# Patient Record
Sex: Female | Born: 1950 | Race: White | Hispanic: No | Marital: Married | Smoking: Never smoker
Health system: Southern US, Community
[De-identification: ages and names within clinical notes are randomized; demographics above are authoritative.]

## PROBLEM LIST (undated history)

## (undated) DIAGNOSIS — E559 Vitamin D deficiency, unspecified: Secondary | ICD-10-CM

## (undated) DIAGNOSIS — M1990 Unspecified osteoarthritis, unspecified site: Secondary | ICD-10-CM

## (undated) DIAGNOSIS — Z87442 Personal history of urinary calculi: Secondary | ICD-10-CM

## (undated) DIAGNOSIS — E119 Type 2 diabetes mellitus without complications: Secondary | ICD-10-CM

## (undated) DIAGNOSIS — F329 Major depressive disorder, single episode, unspecified: Secondary | ICD-10-CM

## (undated) DIAGNOSIS — G4730 Sleep apnea, unspecified: Secondary | ICD-10-CM

## (undated) DIAGNOSIS — E785 Hyperlipidemia, unspecified: Secondary | ICD-10-CM

## (undated) DIAGNOSIS — F32A Depression, unspecified: Secondary | ICD-10-CM

## (undated) DIAGNOSIS — E039 Hypothyroidism, unspecified: Secondary | ICD-10-CM

## (undated) DIAGNOSIS — M75100 Unspecified rotator cuff tear or rupture of unspecified shoulder, not specified as traumatic: Secondary | ICD-10-CM

## (undated) HISTORY — PX: ESOPHAGOGASTRODUODENOSCOPY: SHX1529

## (undated) HISTORY — PX: CHOLECYSTECTOMY: SHX55

## (undated) HISTORY — PX: CARPAL TUNNEL RELEASE: SHX101

## (undated) HISTORY — PX: ABDOMINAL HYSTERECTOMY: SHX81

## (undated) HISTORY — PX: COLONOSCOPY: SHX174

---

## 1994-04-18 HISTORY — PX: BREAST EXCISIONAL BIOPSY: SUR124

## 2004-02-19 ENCOUNTER — Ambulatory Visit: Payer: Self-pay

## 2004-03-15 ENCOUNTER — Ambulatory Visit: Payer: Self-pay | Admitting: Obstetrics and Gynecology

## 2004-07-06 ENCOUNTER — Ambulatory Visit: Payer: Self-pay | Admitting: General Practice

## 2005-04-28 ENCOUNTER — Ambulatory Visit: Payer: Self-pay

## 2005-05-11 ENCOUNTER — Ambulatory Visit: Payer: Self-pay | Admitting: Unknown Physician Specialty

## 2005-09-07 ENCOUNTER — Ambulatory Visit: Payer: Self-pay | Admitting: Unknown Physician Specialty

## 2006-01-13 ENCOUNTER — Ambulatory Visit: Payer: Self-pay | Admitting: Internal Medicine

## 2006-11-20 ENCOUNTER — Ambulatory Visit: Payer: Self-pay

## 2007-04-17 ENCOUNTER — Ambulatory Visit: Payer: Self-pay

## 2008-06-09 ENCOUNTER — Ambulatory Visit: Payer: Self-pay | Admitting: Internal Medicine

## 2008-06-24 ENCOUNTER — Ambulatory Visit: Payer: Self-pay | Admitting: Internal Medicine

## 2008-11-05 ENCOUNTER — Ambulatory Visit: Payer: Self-pay | Admitting: General Practice

## 2009-01-21 ENCOUNTER — Ambulatory Visit: Payer: Self-pay

## 2010-03-22 ENCOUNTER — Ambulatory Visit: Payer: Self-pay

## 2010-07-23 ENCOUNTER — Emergency Department: Payer: Self-pay | Admitting: Emergency Medicine

## 2011-05-11 ENCOUNTER — Ambulatory Visit: Payer: Self-pay

## 2011-09-19 ENCOUNTER — Ambulatory Visit: Payer: Self-pay | Admitting: Unknown Physician Specialty

## 2011-09-21 LAB — PATHOLOGY REPORT

## 2012-05-14 ENCOUNTER — Ambulatory Visit: Payer: Self-pay

## 2013-01-25 IMAGING — CT CT ABD-PELV W/O CM
1 of 2 series · 15 of 32 positions shown, 19 images · non-contrast
Comparison: none

REASON FOR EXAM: (1) LLQ pain hx of diverticulitis; (2) LLQ pain hx of
diverticulitis;    NOTE: N
COMMENTS:

PROCEDURE:     CT  - CT ABDOMEN AND PELVIS W[DATE]  [DATE]
RESULT:
TECHNIQUE: Helical noncontrast 5 mm sections were obtained from the lung
bases through the pubic symphysis.

[Series 2: abdomen · axial · 0.83mm/px · z∈[-868,-428]mm · 15 of 98 slices shown, 19 images]
[im 5/98  soft-tissue]
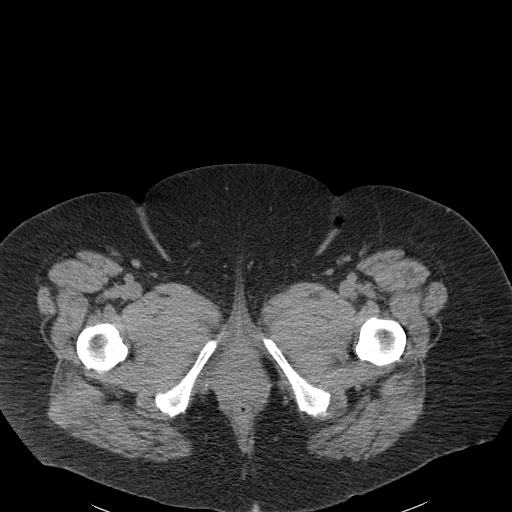
[im 5/98  bone]
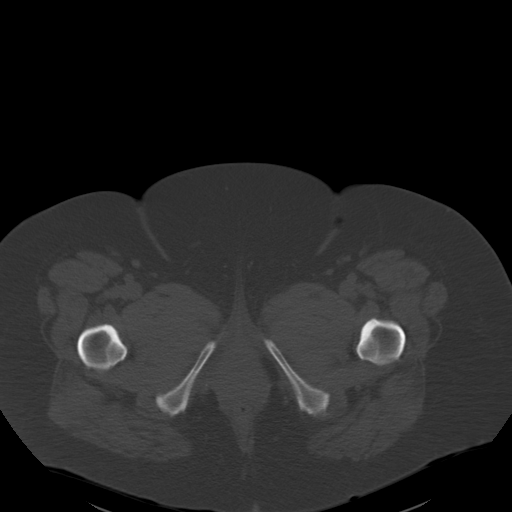
[im 13/98  soft-tissue]
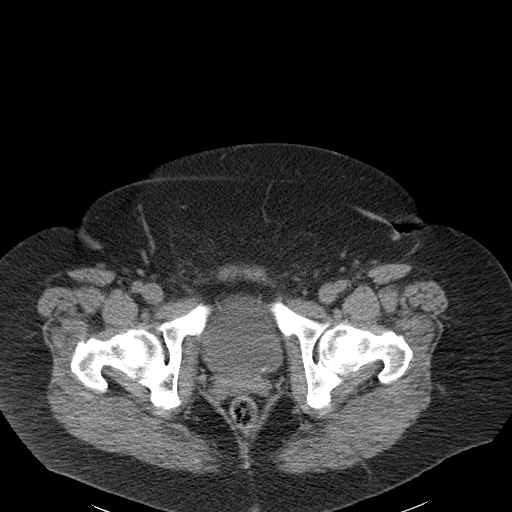
[im 21/98  soft-tissue]
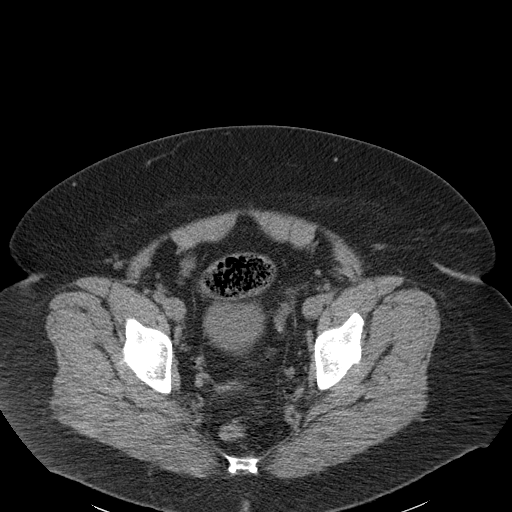
[im 29/98  soft-tissue]
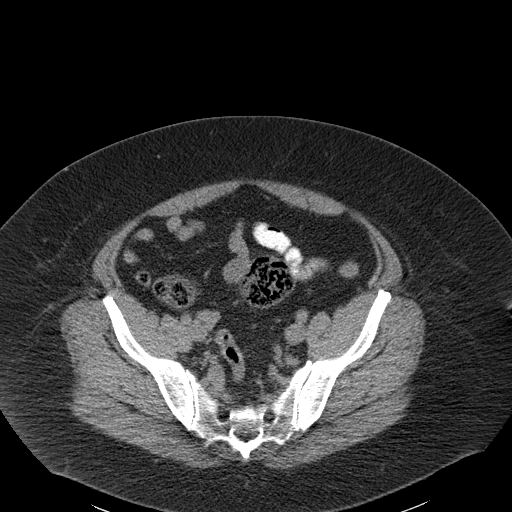
[im 33/98  soft-tissue]
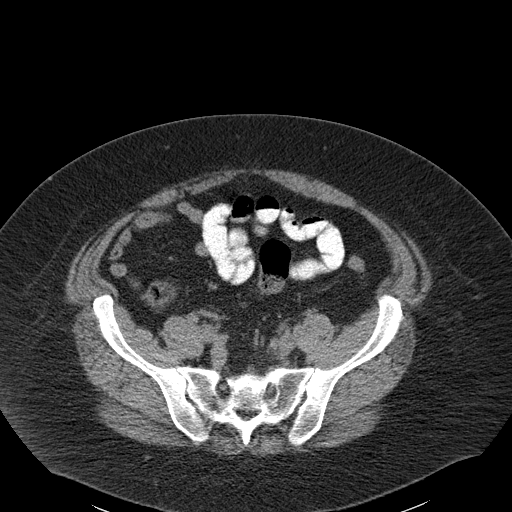
[im 41/98  soft-tissue]
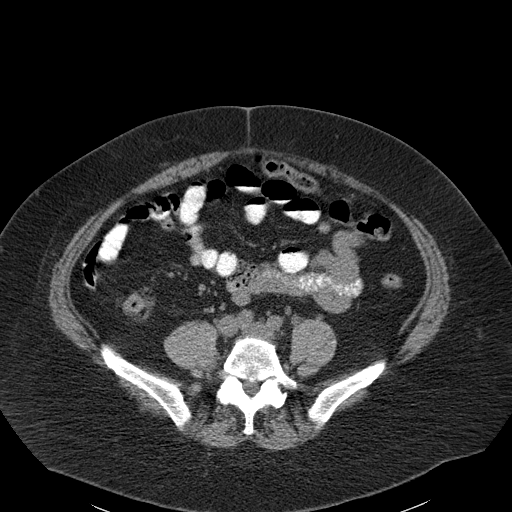
[im 49/98  soft-tissue]
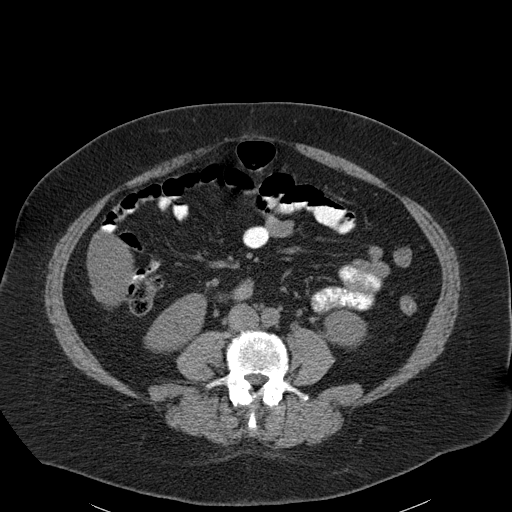
[im 57/98  soft-tissue]
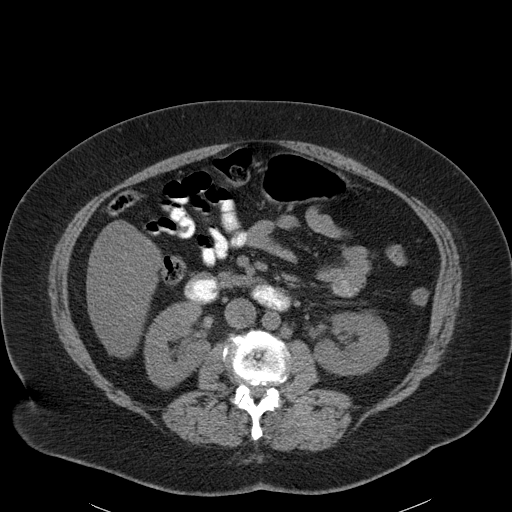
[im 65/98  soft-tissue]
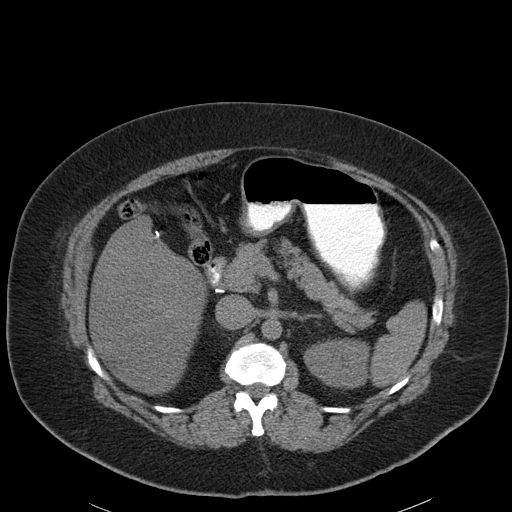
[im 65/98  bone]
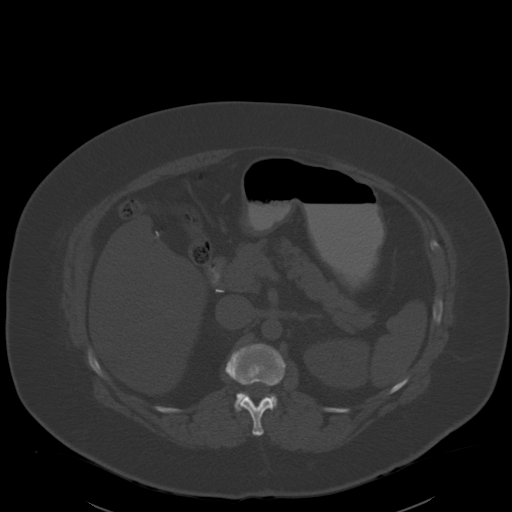
[im 69/98  soft-tissue]
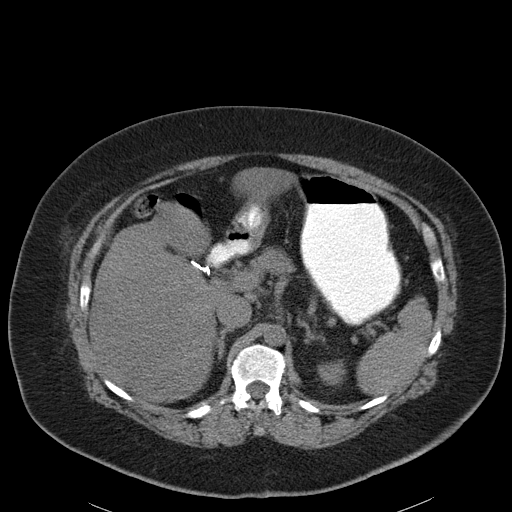
[im 77/98  soft-tissue]
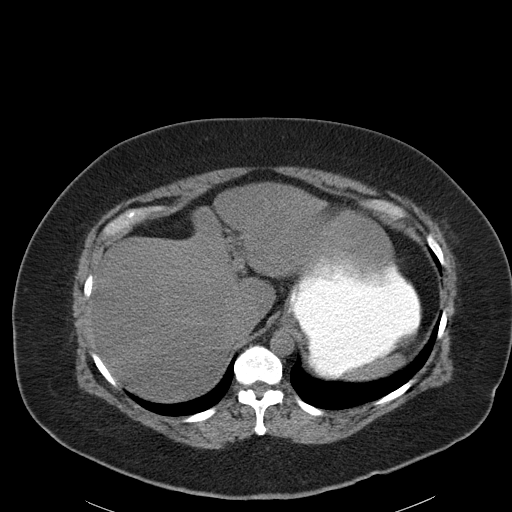
[im 81/98  lung]
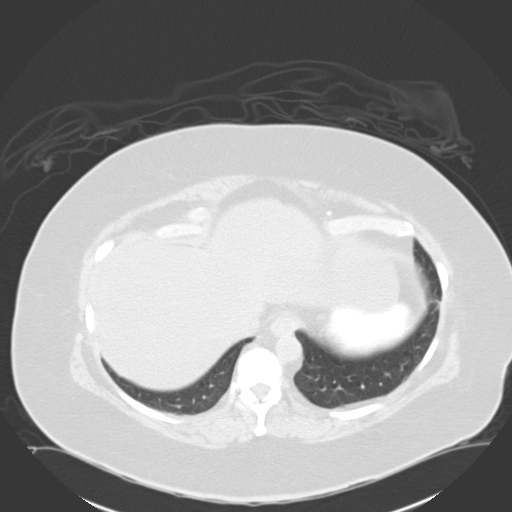
[im 85/98  soft-tissue]
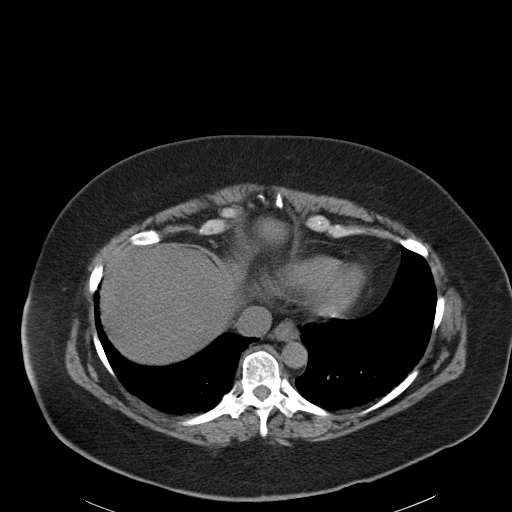
[im 85/98  lung]
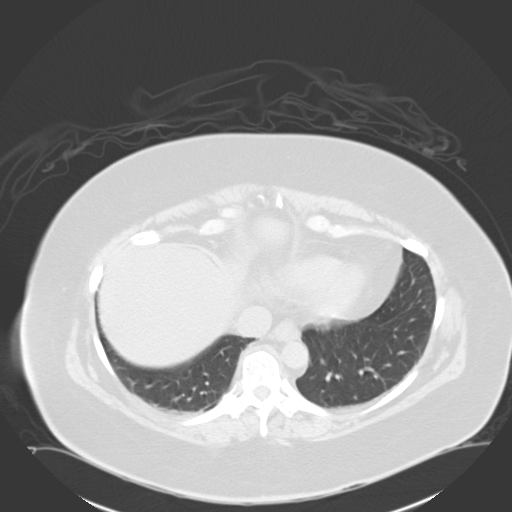
[im 89/98  lung]
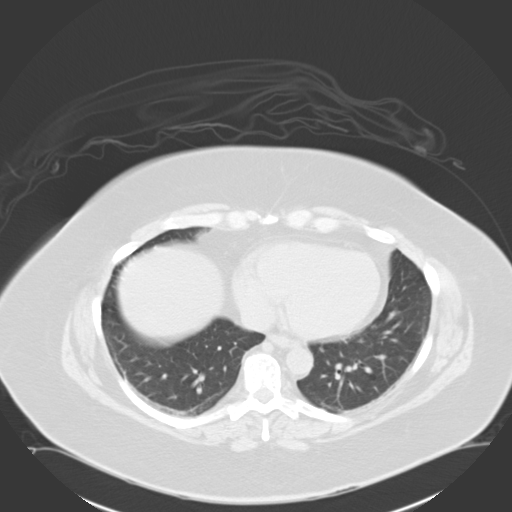
[im 93/98  soft-tissue]
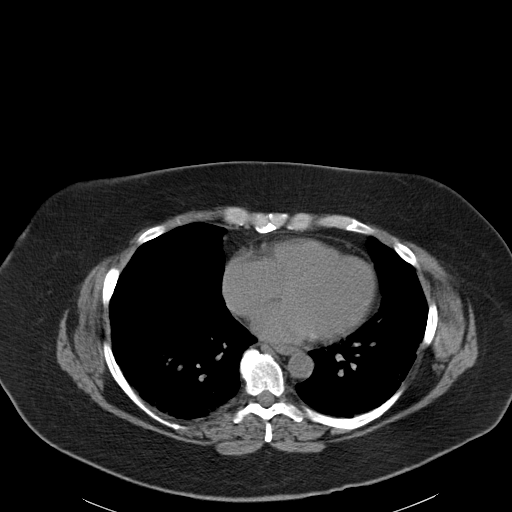
[im 93/98  lung]
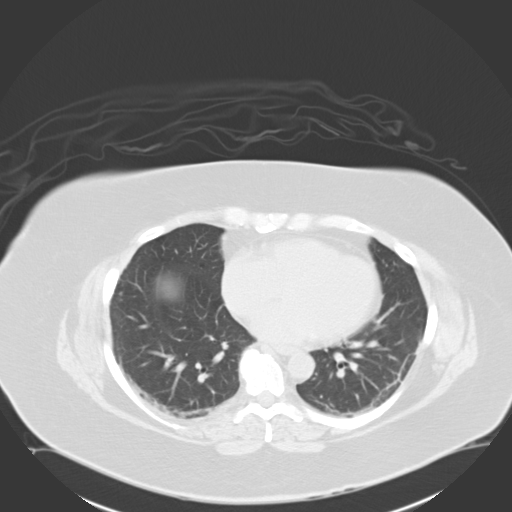

[15 of 32 positions shown; findings below may reference images not displayed]

FINDINGS: Evaluation of the left urinary collection system demonstrates a
very small calculus within the vesicoureteral junction measuring
approximately 1 mm. There is mild hydronephrosis on the left. There is no
further evidence of calculus disease within the left system.

There is no evidence of hydronephrosis nor calculi within the right system.

Noncontrast evaluation of the liver, spleen, adrenals, pancreas and kidneys
are unremarkable. There is no CT evidence of bowel obstruction. There is no
evidence of an abdominal aortic aneurysm.
IMPRESSION: 1. Very small left vesicoureteral junction calculus.
2. No further evidence of acute obstructive or inflammatory abnormalities.
3. There is mild hydronephrosis.

Cati Hg, Physician's Assistant of the Emergency Department was
informed of these findings via a preliminary faxed report.

## 2013-01-25 IMAGING — CR DG ABDOMEN 3V
1 series · 6 of 6 positions shown · non-contrast
Comparison: none

REASON FOR EXAM: abd pain left
COMMENTS:

PROCEDURE:     DXR - DXR ABDOMEN 3-WAY (INCL PA CXR)  - July 23, 2010  [DATE]
RESULT:

[Series 1: view not recorded · 0.17mm/px · 6 of 6 slices shown]
[im 1/6]
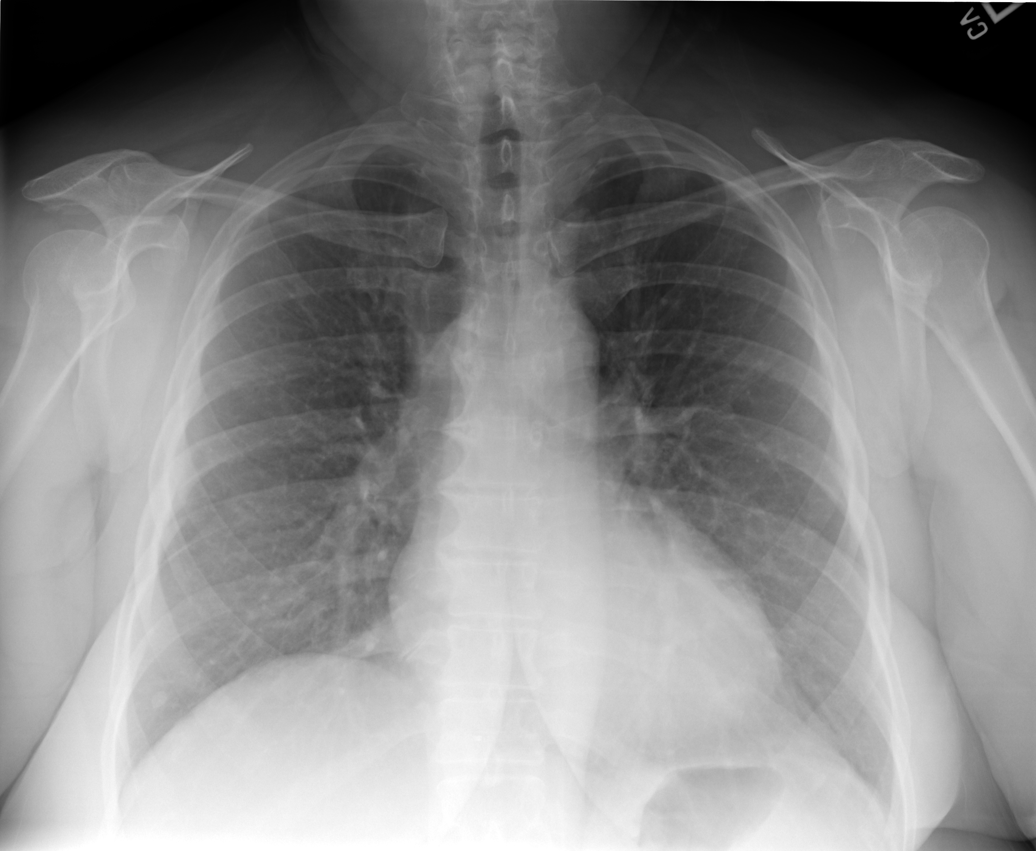
[im 2/6]
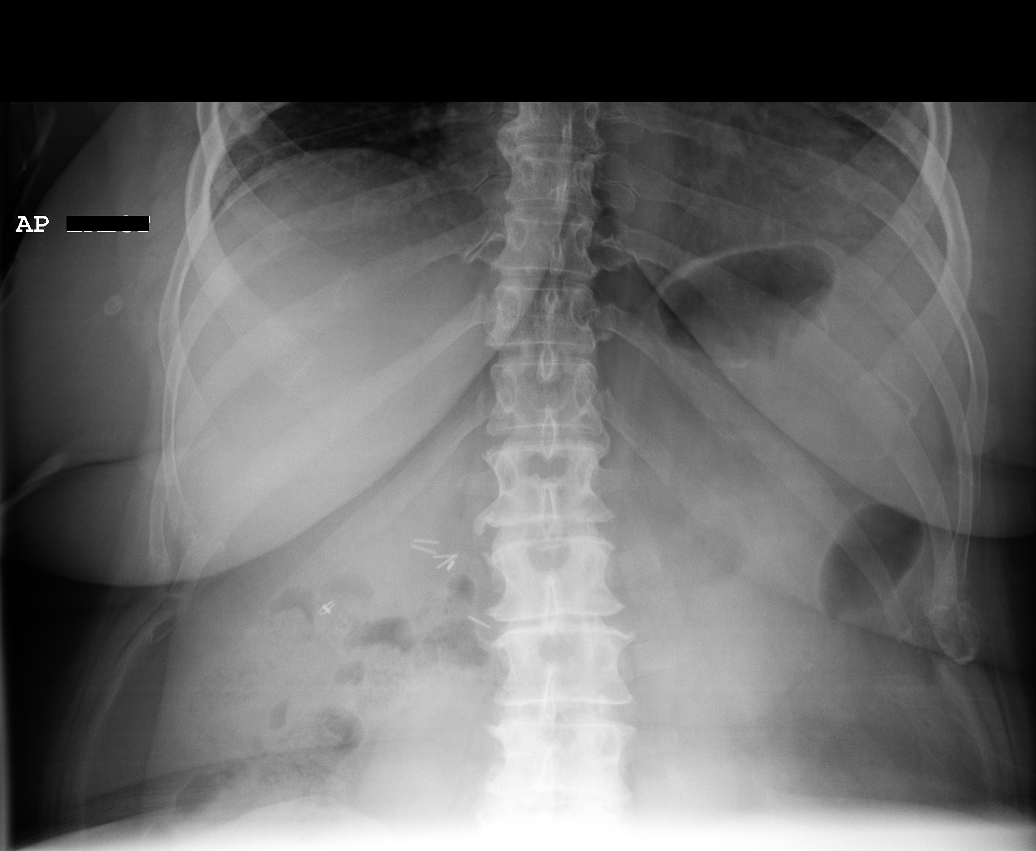
[im 3/6]
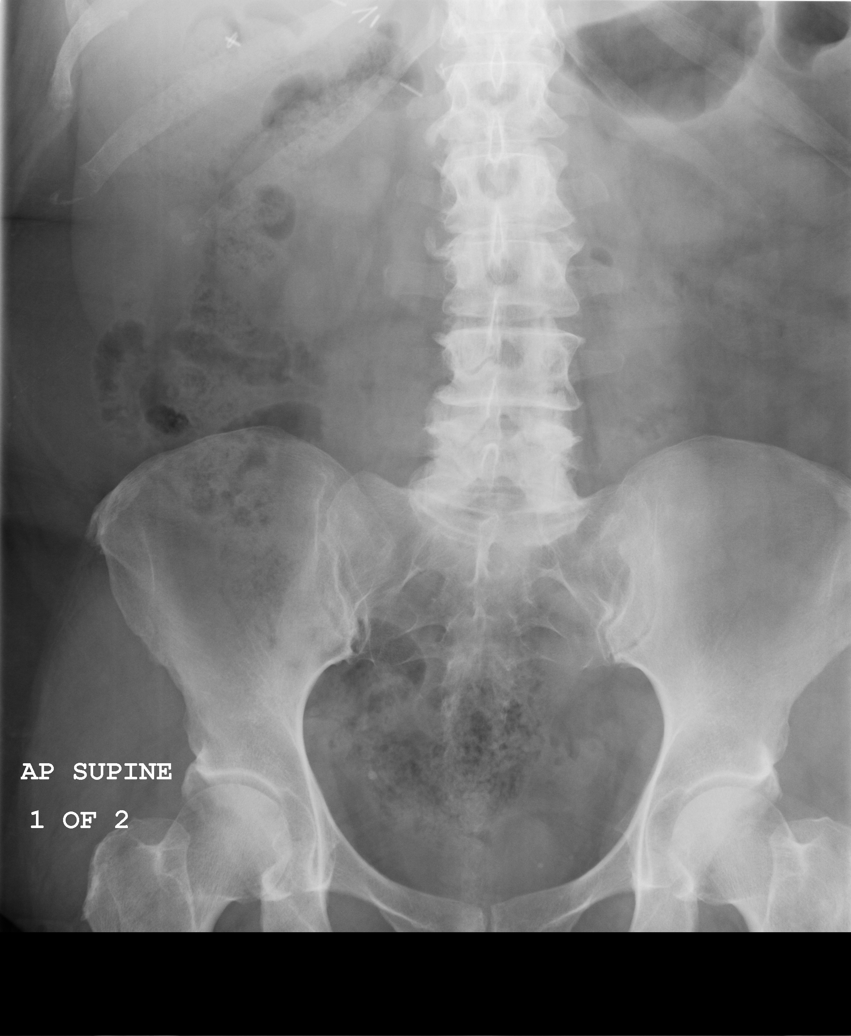
[im 4/6]
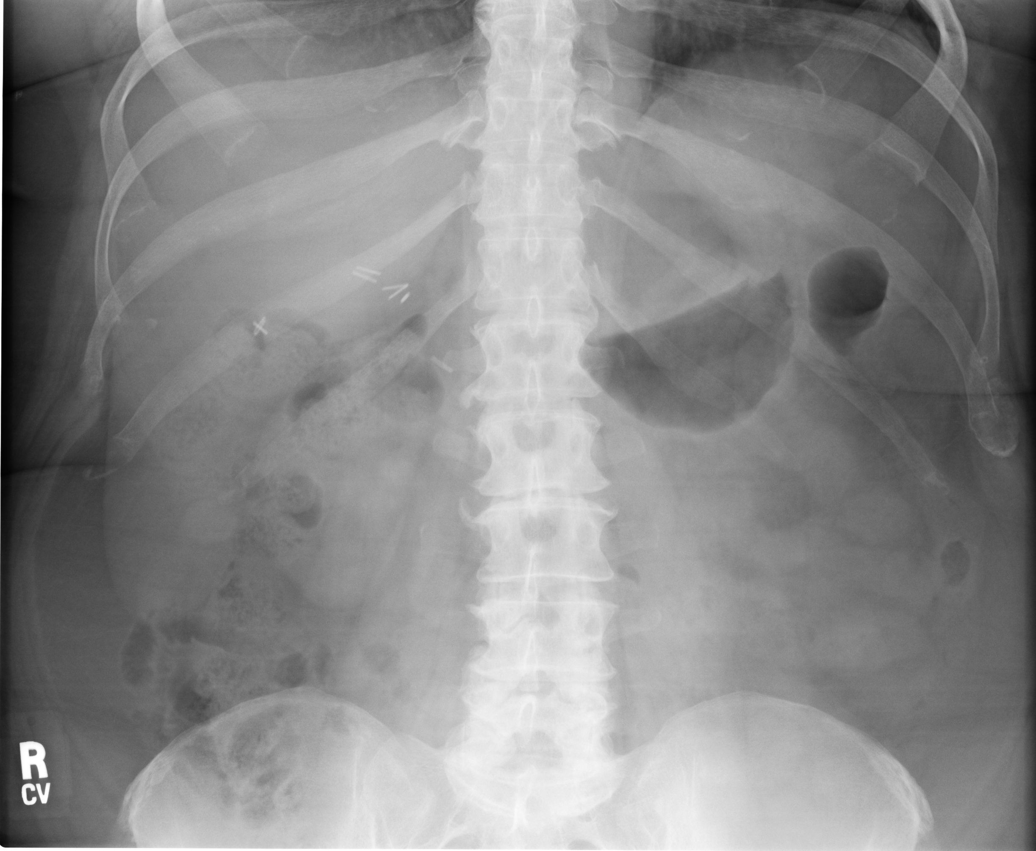
[im 5/6]
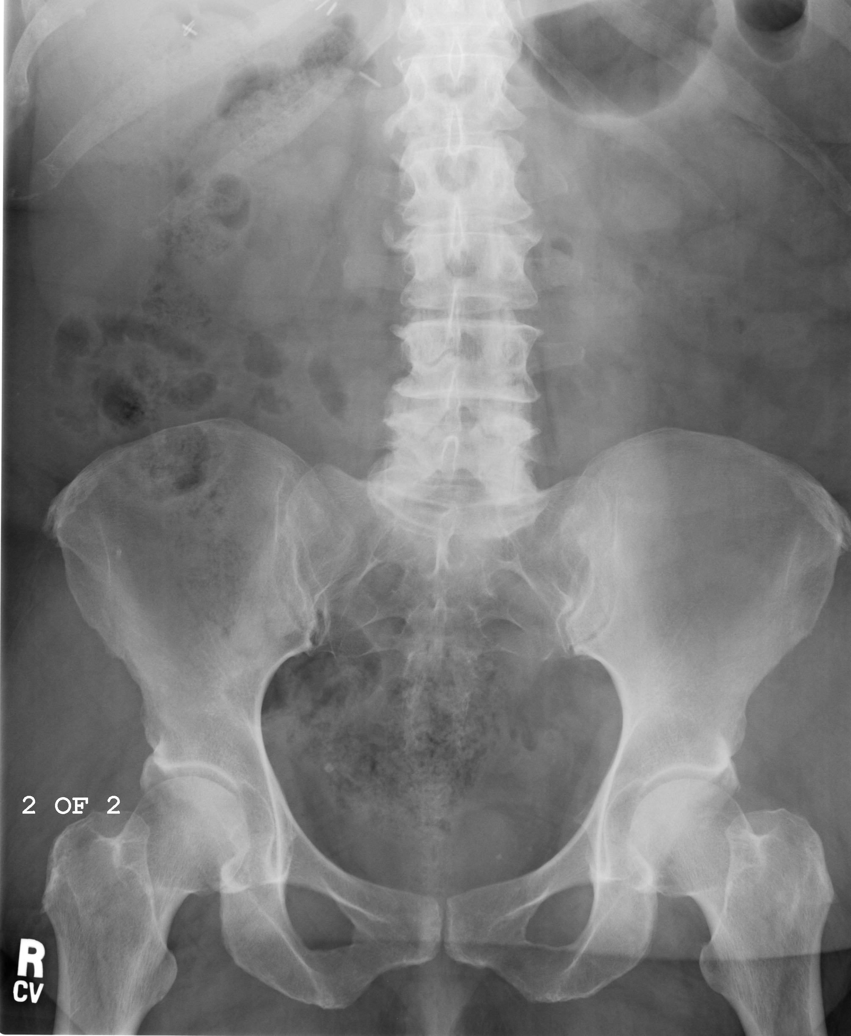
[im 6/6]
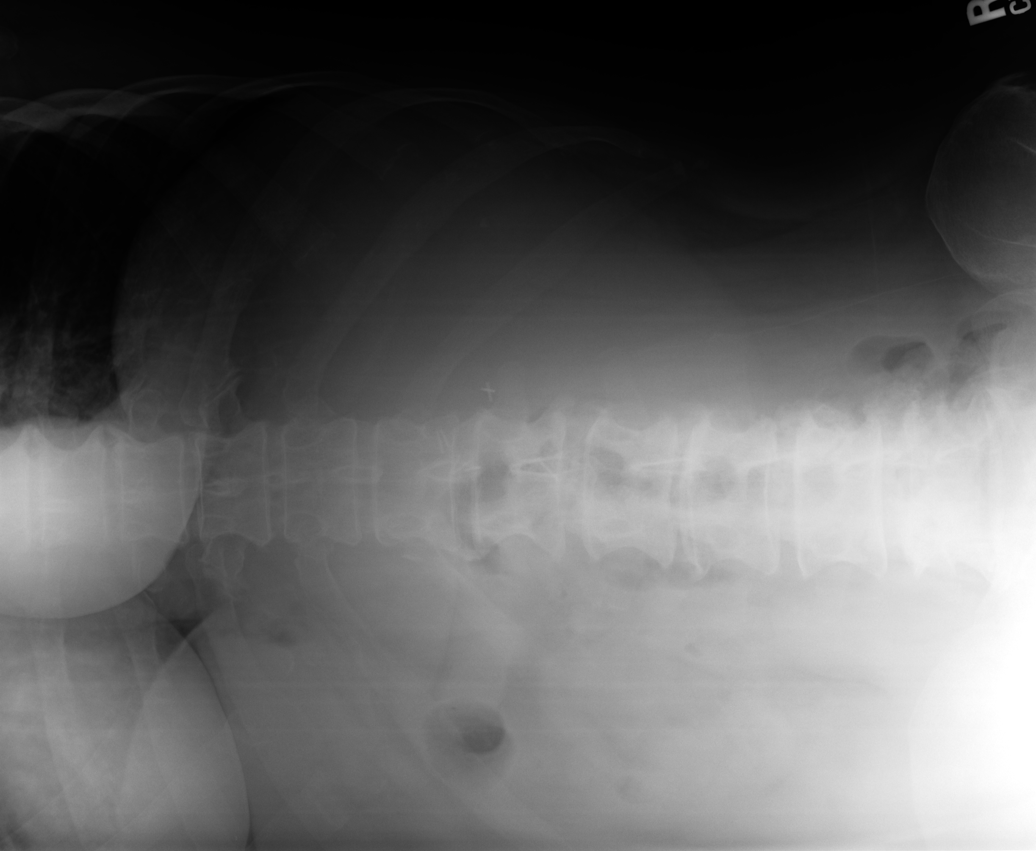

[6 of 6 positions shown; findings below may reference images not displayed]

FINDINGS: Frontal view of the chest demonstrates no evidence of focal
infiltrates, effusions or edema. Air is seen within nondilated loops of
large and small bowel. The visualized body skeleton is unremarkable. A
moderate amount of stool is appreciated within the colon.
IMPRESSION: 1. Non-obstructive bowel gas pattern.
2. Chest radiograph without evidence of acute cardiopulmonary disease.

## 2013-05-15 ENCOUNTER — Ambulatory Visit: Payer: Self-pay

## 2014-02-17 DIAGNOSIS — E1169 Type 2 diabetes mellitus with other specified complication: Secondary | ICD-10-CM | POA: Insufficient documentation

## 2014-02-18 DIAGNOSIS — E559 Vitamin D deficiency, unspecified: Secondary | ICD-10-CM | POA: Insufficient documentation

## 2014-06-16 ENCOUNTER — Ambulatory Visit: Payer: Self-pay

## 2015-07-03 DIAGNOSIS — E7800 Pure hypercholesterolemia, unspecified: Secondary | ICD-10-CM | POA: Insufficient documentation

## 2016-03-03 ENCOUNTER — Other Ambulatory Visit: Payer: Self-pay | Admitting: Obstetrics and Gynecology

## 2016-03-03 DIAGNOSIS — Z1231 Encounter for screening mammogram for malignant neoplasm of breast: Secondary | ICD-10-CM

## 2016-04-28 ENCOUNTER — Ambulatory Visit
Admission: RE | Admit: 2016-04-28 | Discharge: 2016-04-28 | Disposition: A | Discharge status: Home / Self Care | Payer: Managed Care, Other (non HMO) | Source: Ambulatory Visit | Attending: Obstetrics and Gynecology | Admitting: Obstetrics and Gynecology

## 2016-04-28 ENCOUNTER — Encounter: Payer: Self-pay | Admitting: Radiology

## 2016-04-28 DIAGNOSIS — Z1231 Encounter for screening mammogram for malignant neoplasm of breast: Secondary | ICD-10-CM | POA: Insufficient documentation

## 2017-02-13 DIAGNOSIS — G2581 Restless legs syndrome: Secondary | ICD-10-CM | POA: Insufficient documentation

## 2017-03-23 ENCOUNTER — Other Ambulatory Visit: Payer: Self-pay | Admitting: Obstetrics & Gynecology

## 2017-03-23 DIAGNOSIS — Z1231 Encounter for screening mammogram for malignant neoplasm of breast: Secondary | ICD-10-CM

## 2017-05-04 ENCOUNTER — Ambulatory Visit
Admission: RE | Admit: 2017-05-04 | Discharge: 2017-05-04 | Disposition: A | Discharge status: Home / Self Care | Payer: Managed Care, Other (non HMO) | Source: Ambulatory Visit | Attending: Obstetrics & Gynecology | Admitting: Obstetrics & Gynecology

## 2017-05-04 DIAGNOSIS — Z1231 Encounter for screening mammogram for malignant neoplasm of breast: Secondary | ICD-10-CM | POA: Diagnosis not present

## 2018-05-04 ENCOUNTER — Encounter: Payer: Self-pay | Admitting: *Deleted

## 2018-05-05 ENCOUNTER — Encounter: Payer: Self-pay | Admitting: Anesthesiology

## 2018-05-07 ENCOUNTER — Ambulatory Visit: Payer: Managed Care, Other (non HMO) | Admitting: Anesthesiology

## 2018-05-07 ENCOUNTER — Encounter
Admission: RE | Disposition: A | Discharge status: Home / Self Care | Payer: Self-pay | Source: Home / Self Care | Attending: Unknown Physician Specialty

## 2018-05-07 ENCOUNTER — Ambulatory Visit
Admission: RE | Admit: 2018-05-07 | Discharge: 2018-05-07 | Disposition: A | Discharge status: Home / Self Care | Payer: Managed Care, Other (non HMO) | Attending: Unknown Physician Specialty | Admitting: Unknown Physician Specialty

## 2018-05-07 DIAGNOSIS — Z8371 Family history of colonic polyps: Secondary | ICD-10-CM | POA: Insufficient documentation

## 2018-05-07 DIAGNOSIS — E119 Type 2 diabetes mellitus without complications: Secondary | ICD-10-CM | POA: Diagnosis not present

## 2018-05-07 DIAGNOSIS — E785 Hyperlipidemia, unspecified: Secondary | ICD-10-CM | POA: Diagnosis not present

## 2018-05-07 DIAGNOSIS — Z79890 Hormone replacement therapy (postmenopausal): Secondary | ICD-10-CM | POA: Diagnosis not present

## 2018-05-07 DIAGNOSIS — G4730 Sleep apnea, unspecified: Secondary | ICD-10-CM | POA: Diagnosis not present

## 2018-05-07 DIAGNOSIS — K5730 Diverticulosis of large intestine without perforation or abscess without bleeding: Secondary | ICD-10-CM | POA: Insufficient documentation

## 2018-05-07 DIAGNOSIS — Z7982 Long term (current) use of aspirin: Secondary | ICD-10-CM | POA: Insufficient documentation

## 2018-05-07 DIAGNOSIS — Z1211 Encounter for screening for malignant neoplasm of colon: Secondary | ICD-10-CM | POA: Diagnosis not present

## 2018-05-07 DIAGNOSIS — Z7984 Long term (current) use of oral hypoglycemic drugs: Secondary | ICD-10-CM | POA: Diagnosis not present

## 2018-05-07 DIAGNOSIS — F329 Major depressive disorder, single episode, unspecified: Secondary | ICD-10-CM | POA: Diagnosis not present

## 2018-05-07 DIAGNOSIS — Z79899 Other long term (current) drug therapy: Secondary | ICD-10-CM | POA: Insufficient documentation

## 2018-05-07 DIAGNOSIS — E559 Vitamin D deficiency, unspecified: Secondary | ICD-10-CM | POA: Diagnosis not present

## 2018-05-07 DIAGNOSIS — E039 Hypothyroidism, unspecified: Secondary | ICD-10-CM | POA: Insufficient documentation

## 2018-05-07 DIAGNOSIS — K640 First degree hemorrhoids: Secondary | ICD-10-CM | POA: Insufficient documentation

## 2018-05-07 HISTORY — DX: Vitamin D deficiency, unspecified: E55.9

## 2018-05-07 HISTORY — DX: Type 2 diabetes mellitus without complications: E11.9

## 2018-05-07 HISTORY — DX: Unspecified rotator cuff tear or rupture of unspecified shoulder, not specified as traumatic: M75.100

## 2018-05-07 HISTORY — DX: Sleep apnea, unspecified: G47.30

## 2018-05-07 HISTORY — PX: COLONOSCOPY WITH PROPOFOL: SHX5780

## 2018-05-07 HISTORY — DX: Hyperlipidemia, unspecified: E78.5

## 2018-05-07 HISTORY — DX: Depression, unspecified: F32.A

## 2018-05-07 HISTORY — DX: Major depressive disorder, single episode, unspecified: F32.9

## 2018-05-07 HISTORY — DX: Hypothyroidism, unspecified: E03.9

## 2018-05-07 LAB — GLUCOSE, CAPILLARY: Glucose-Capillary: 156 mg/dL — ABNORMAL HIGH (ref 70–99)

## 2018-05-07 SURGERY — COLONOSCOPY WITH PROPOFOL
Anesthesia: General

## 2018-05-07 MED ORDER — LIDOCAINE HCL (PF) 2 % IJ SOLN
INTRAMUSCULAR | Status: AC
  Filled 2018-05-07: qty 10

## 2018-05-07 MED ORDER — MIDAZOLAM HCL 5 MG/5ML IJ SOLN
INTRAMUSCULAR | Status: DC | PRN
  Administered 2018-05-07: 2 mg via INTRAVENOUS

## 2018-05-07 MED ORDER — PROPOFOL 10 MG/ML IV BOLUS
INTRAVENOUS | Status: DC | PRN
  Administered 2018-05-07: 30 mg via INTRAVENOUS
  Administered 2018-05-07: 20 mg via INTRAVENOUS

## 2018-05-07 MED ORDER — LIDOCAINE HCL (PF) 2 % IJ SOLN
INTRAMUSCULAR | Status: DC | PRN
  Administered 2018-05-07: 100 mg

## 2018-05-07 MED ORDER — SODIUM CHLORIDE 0.9 % IV SOLN
INTRAVENOUS | Status: DC
  Administered 2018-05-07: 1000 mL via INTRAVENOUS

## 2018-05-07 MED ORDER — MIDAZOLAM HCL 2 MG/2ML IJ SOLN
INTRAMUSCULAR | Status: AC
  Filled 2018-05-07: qty 2

## 2018-05-07 MED ORDER — PROPOFOL 500 MG/50ML IV EMUL
INTRAVENOUS | Status: AC
  Filled 2018-05-07: qty 50

## 2018-05-07 MED ORDER — PROPOFOL 500 MG/50ML IV EMUL
INTRAVENOUS | Status: DC | PRN
  Administered 2018-05-07: 50 ug/kg/min via INTRAVENOUS

## 2018-05-07 MED ORDER — FENTANYL CITRATE (PF) 100 MCG/2ML IJ SOLN
INTRAMUSCULAR | Status: DC | PRN
  Administered 2018-05-07 (×4): 25 ug via INTRAVENOUS

## 2018-05-07 MED ORDER — SODIUM CHLORIDE 0.9 % IV SOLN: INTRAVENOUS | Status: DC

## 2018-05-07 MED ORDER — FENTANYL CITRATE (PF) 100 MCG/2ML IJ SOLN
INTRAMUSCULAR | Status: AC
  Filled 2018-05-07: qty 2

## 2018-05-07 NOTE — Transfer of Care (Signed)
Immediate Anesthesia Transfer of Care Note  Patient: Wanda Ryan  Procedure(s) Performed: COLONOSCOPY WITH PROPOFOL (N/A )  Patient Location: PACU  Anesthesia Type:General  Level of Consciousness: sedated  Airway & Oxygen Therapy: Patient Spontanous Breathing and Patient connected to nasal cannula oxygen  Post-op Assessment: Report given to RN and Post -op Vital signs reviewed and stable  Post vital signs: Reviewed and stable  Last Vitals:  Vitals Value Taken Time  BP 110/57 05/07/2018  9:03 AM  Temp 36.2 C 05/07/2018  9:03 AM  Pulse 71 05/07/2018  9:04 AM  Resp 17 05/07/2018  9:04 AM  SpO2 98 % 05/07/2018  9:04 AM  Vitals shown include unvalidated device data.  Last Pain:  Vitals:   05/07/18 0903  TempSrc: Tympanic  PainSc: Asleep         Complications: No apparent anesthesia complications

## 2018-05-07 NOTE — Anesthesia Preprocedure Evaluation (Signed)
Anesthesia Evaluation  Patient identified by MRN, date of birth, ID band Patient awake    Reviewed: Allergy & Precautions, NPO status , Patient's Chart, lab work & pertinent test results, reviewed documented beta blocker date and time   Airway Mallampati: III  TM Distance: >3 FB     Dental  (+) Chipped   Pulmonary sleep apnea and Continuous Positive Airway Pressure Ventilation ,           Cardiovascular      Neuro/Psych PSYCHIATRIC DISORDERS Depression    GI/Hepatic   Endo/Other  diabetes, Type 2Hypothyroidism   Renal/GU      Musculoskeletal   Abdominal   Peds  Hematology   Anesthesia Other Findings   Reproductive/Obstetrics                             Anesthesia Physical Anesthesia Plan  ASA: III  Anesthesia Plan: General   Post-op Pain Management:    Induction: Intravenous  PONV Risk Score and Plan:   Airway Management Planned:   Additional Equipment:   Intra-op Plan:   Post-operative Plan:   Informed Consent: I have reviewed the patients History and Physical, chart, labs and discussed the procedure including the risks, benefits and alternatives for the proposed anesthesia with the patient or authorized representative who has indicated his/her understanding and acceptance.       Plan Discussed with: CRNA  Anesthesia Plan Comments:         Anesthesia Quick Evaluation

## 2018-05-07 NOTE — Anesthesia Post-op Follow-up Note (Signed)
Anesthesia QCDR form completed.        

## 2018-05-07 NOTE — Op Note (Signed)
[facility] Gastroenterology Patient Name: Wanda Ryan Procedure Date: 05/07/2018 8:23 AM MRN: [id number] Account #: [id number] Date of Birth: Oct 19, 1950 Admit Type: Outpatient Age: 68 Room: [facility] ENDO ROOM 1 Gender: Female Note Status: Finalized Procedure:            Colonoscopy Indications:          Colon cancer screening in patient at increased risk:                        Family history of 1st-degree relative with colon polyps Providers:            Scot Jun, MD Medicines:            Propofol per Anesthesia Complications:        No immediate complications. Procedure:            Pre-Anesthesia Assessment:                       - After reviewing the risks and benefits, the patient                        was deemed in satisfactory condition to undergo the                        procedure.                       After obtaining informed consent, the colonoscope was                        passed under direct vision. Throughout the procedure,                        the patient's blood pressure, pulse, and oxygen                        saturations were monitored continuously. The                        Colonoscope was introduced through the anus and                        advanced to the the cecum, identified by appendiceal                        orifice and ileocecal valve. The colonoscopy was                        somewhat difficult. due to continual fuzzyness of the                        screen due to simethecone. Findings:      Multiple small-mouthed diverticula were found in the sigmoid colon.      Internal hemorrhoids were found during endoscopy. The hemorrhoids were       small and Grade I (internal hemorrhoids that do not prolapse).      The exam was otherwise without abnormality. Impression:           - Diverticulosis in the sigmoid colon.                       -  Internal hemorrhoids.                       - The examination was otherwise normal.                       - No specimens collected. Recommendation:       - Repeat colonoscopy in 5 years for screening purposes. Scot Junobert T Elliott, MD 05/07/2018 9:07:26 AM This report has been signed electronically. Number of Addenda: 0 Note Initiated On: 05/07/2018 8:23 AM Scope Withdrawal Time: 0 hours 16 minutes 7 seconds  Total Procedure Duration: 0 hours 23 minutes 17 seconds       [facility]

## 2018-05-07 NOTE — Anesthesia Postprocedure Evaluation (Signed)
Anesthesia Post Note  Patient: Wanda Ryan  Procedure(s) Performed: COLONOSCOPY WITH PROPOFOL (N/A )  Patient location during evaluation: Endoscopy Anesthesia Type: General Level of consciousness: awake and alert Pain management: pain level controlled Vital Signs Assessment: post-procedure vital signs reviewed and stable Respiratory status: spontaneous breathing, nonlabored ventilation, respiratory function stable and patient connected to nasal cannula oxygen Cardiovascular status: blood pressure returned to baseline and stable Postop Assessment: no apparent nausea or vomiting Anesthetic complications: no     Last Vitals:  Vitals:   05/07/18 0923 05/07/18 0933  BP: 115/66 125/64  Pulse: 66 70  Resp: 14 20  Temp:    SpO2: 97% 96%    Last Pain:  Vitals:   05/07/18 0933  TempSrc:   PainSc: 0-No pain                 Dakota Stangl S

## 2018-05-07 NOTE — H&P (Signed)
Primary Care Physician:  Marisue Ivan, MD Primary Gastroenterologist:  Dr. Mechele Collin  Pre-Procedure History & Physical: HPI:  Wanda Ryan is a 68 y.o. female is here for an colonoscopy.   Past Medical History:  Diagnosis Date  . Depression   . Diabetes mellitus without complication (HCC)   . Hyperlipidemia   . Hypothyroidism   . Rotator cuff syndrome   . Sleep apnea    on CPAP  . Vitamin D deficiency     Past Surgical History:  Procedure Laterality Date  . ABDOMINAL HYSTERECTOMY    . BREAST EXCISIONAL BIOPSY Right 1996   neg  . CARPAL TUNNEL RELEASE    . CHOLECYSTECTOMY    . COLONOSCOPY    . ESOPHAGOGASTRODUODENOSCOPY      Prior to Admission medications   Medication Sig Start Date End Date Taking? Authorizing Provider  acetaminophen (TYLENOL) 500 MG tablet Take 1,000 mg by mouth every 8 (eight) hours as needed.   Yes [provider]  aspirin EC 81 MG tablet Take 81 mg by mouth daily.   Yes [provider]  calcium carbonate (OSCAL) 1500 (600 Ca) MG TABS tablet Take 600 mg of elemental calcium by mouth daily with breakfast.   Yes [provider]  citalopram (CELEXA) 20 MG tablet Take 20 mg by mouth daily.   Yes [provider]  ergocalciferol (VITAMIN D2) 1.25 MG (50000 UT) capsule Take 50,000 Units by mouth once a week.   Yes [provider]  gabapentin (NEURONTIN) 300 MG capsule Take 300 mg by mouth at bedtime.   Yes [provider]  glimepiride (AMARYL) 4 MG tablet Take 4 mg by mouth daily with breakfast.   Yes [provider]  levothyroxine (SYNTHROID, LEVOTHROID) 125 MCG tablet Take 125 mcg by mouth daily before breakfast.   Yes [provider]  pravastatin (PRAVACHOL) 80 MG tablet Take 80 mg by mouth daily.   Yes [provider]    Allergies as of 03/02/2018  . (Not on File)    Family History  Problem Relation Age of Onset  . Breast cancer Paternal Grandmother 29     Social History   Socioeconomic History  . Marital status: Married    Spouse name: Not on file  . Number of children: Not on file  . Years of education: Not on file  . Highest education level: Not on file  Occupational History  . Not on file  Social Needs  . Financial resource strain: Not on file  . Food insecurity:    Worry: Not on file    Inability: Not on file  . Transportation needs:    Medical: Not on file    Non-medical: Not on file  Tobacco Use  . Smoking status: Never Smoker  . Smokeless tobacco: Never Used  Substance and Sexual Activity  . Alcohol use: Not Currently  . Drug use: Never  . Sexual activity: Not on file  Lifestyle  . Physical activity:    Days per week: Not on file    Minutes per session: Not on file  . Stress: Not on file  Relationships  . Social connections:    Talks on phone: Not on file    Gets together: Not on file    Attends religious service: Not on file    Active member of club or organization: Not on file    Attends meetings of clubs or organizations: Not on file    Relationship status: Not on file  .  Intimate partner violence:    Fear of current or ex partner: Not on file    Emotionally abused: Not on file    Physically abused: Not on file    Forced sexual activity: Not on file  Other Topics Concern  . Not on file  Social History Narrative  . Not on file    Review of Systems: See HPI, otherwise negative ROS  Physical Exam: BP (!) 152/66   Pulse 85   Temp (!) 96.4 F (35.8 C) (Tympanic)   Resp 18   Ht 5\' 3"  (1.6 m)   Wt 116.1 kg   SpO2 99%   BMI 45.35 kg/m  General:   Alert,  pleasant and cooperative in NAD Head:  Normocephalic and atraumatic. Neck:  Supple; no masses or thyromegaly. Lungs:  Clear throughout to auscultation.    Heart:  Regular rate and rhythm. Abdomen:  Soft, nontender and nondistended. Normal bowel sounds, without guarding, and without rebound.   Neurologic:  Alert and  oriented x4;  grossly  normal neurologically.  Impression/Plan: Wanda Ryan is here for an colonoscopy to be performed for family history of colon polyps.  Last one 09/19/2011, no polyps found on that exam.  Risks, benefits, limitations, and alternatives regarding  colonoscopy have been reviewed with the patient.  Questions have been answered.  All parties agreeable.   Lynnae PrudeELLIOTT, ROBERT, MD  05/07/2018, 8:29 AM

## 2018-05-08 ENCOUNTER — Encounter: Payer: Self-pay | Admitting: Unknown Physician Specialty

## 2018-08-07 ENCOUNTER — Encounter: Admission: RE | Payer: Self-pay | Source: Home / Self Care

## 2018-08-07 ENCOUNTER — Ambulatory Visit: Admission: RE | Admit: 2018-08-07 | Payer: Medicare Other | Source: Home / Self Care | Admitting: Ophthalmology

## 2018-08-07 SURGERY — PHACOEMULSIFICATION, CATARACT, WITH IOL INSERTION
Anesthesia: Choice | Laterality: Left

## 2019-02-16 ENCOUNTER — Ambulatory Visit: Admit: 2019-02-16 | Payer: Medicare Other | Admitting: Ophthalmology

## 2019-02-16 SURGERY — PHACOEMULSIFICATION, CATARACT, WITH IOL INSERTION
Anesthesia: Topical | Laterality: Left

## 2019-04-03 ENCOUNTER — Other Ambulatory Visit: Payer: Self-pay | Admitting: Obstetrics and Gynecology

## 2019-04-03 DIAGNOSIS — Z1231 Encounter for screening mammogram for malignant neoplasm of breast: Secondary | ICD-10-CM

## 2019-05-09 ENCOUNTER — Ambulatory Visit
Admission: RE | Admit: 2019-05-09 | Discharge: 2019-05-09 | Disposition: A | Discharge status: Home / Self Care | Payer: Managed Care, Other (non HMO) | Source: Ambulatory Visit | Attending: Obstetrics and Gynecology | Admitting: Obstetrics and Gynecology

## 2019-05-09 DIAGNOSIS — Z1231 Encounter for screening mammogram for malignant neoplasm of breast: Secondary | ICD-10-CM | POA: Diagnosis not present

## 2020-03-24 ENCOUNTER — Encounter: Payer: Self-pay | Admitting: Ophthalmology

## 2020-03-30 ENCOUNTER — Other Ambulatory Visit: Payer: Self-pay

## 2020-03-30 ENCOUNTER — Other Ambulatory Visit
Admission: RE | Admit: 2020-03-30 | Discharge: 2020-03-30 | Disposition: A | Discharge status: Home / Self Care | Payer: Managed Care, Other (non HMO) | Source: Ambulatory Visit | Attending: Ophthalmology | Admitting: Ophthalmology

## 2020-03-30 DIAGNOSIS — Z20822 Contact with and (suspected) exposure to covid-19: Secondary | ICD-10-CM | POA: Insufficient documentation

## 2020-03-30 DIAGNOSIS — Z01812 Encounter for preprocedural laboratory examination: Secondary | ICD-10-CM | POA: Diagnosis not present

## 2020-03-30 NOTE — Discharge Instructions (Signed)

## 2020-03-31 LAB — SARS CORONAVIRUS 2 (TAT 6-24 HRS): SARS Coronavirus 2: NEGATIVE

## 2020-04-01 ENCOUNTER — Encounter: Payer: Self-pay | Admitting: Ophthalmology

## 2020-04-01 ENCOUNTER — Other Ambulatory Visit: Payer: Self-pay

## 2020-04-01 ENCOUNTER — Encounter
Admission: RE | Disposition: A | Discharge status: Home / Self Care | Payer: Self-pay | Source: Home / Self Care | Attending: Ophthalmology

## 2020-04-01 ENCOUNTER — Ambulatory Visit
Admission: RE | Admit: 2020-04-01 | Discharge: 2020-04-01 | Disposition: A | Discharge status: Home / Self Care | Payer: Managed Care, Other (non HMO) | Attending: Ophthalmology | Admitting: Ophthalmology

## 2020-04-01 ENCOUNTER — Ambulatory Visit: Payer: Managed Care, Other (non HMO) | Admitting: Anesthesiology

## 2020-04-01 DIAGNOSIS — Z8616 Personal history of COVID-19: Secondary | ICD-10-CM | POA: Diagnosis not present

## 2020-04-01 DIAGNOSIS — H2512 Age-related nuclear cataract, left eye: Secondary | ICD-10-CM | POA: Diagnosis not present

## 2020-04-01 DIAGNOSIS — Z7984 Long term (current) use of oral hypoglycemic drugs: Secondary | ICD-10-CM | POA: Insufficient documentation

## 2020-04-01 DIAGNOSIS — Z79899 Other long term (current) drug therapy: Secondary | ICD-10-CM | POA: Insufficient documentation

## 2020-04-01 DIAGNOSIS — E119 Type 2 diabetes mellitus without complications: Secondary | ICD-10-CM | POA: Insufficient documentation

## 2020-04-01 HISTORY — DX: Unspecified osteoarthritis, unspecified site: M19.90

## 2020-04-01 HISTORY — PX: CATARACT EXTRACTION W/PHACO: SHX586

## 2020-04-01 LAB — GLUCOSE, CAPILLARY
Glucose-Capillary: 94 mg/dL (ref 70–99)
Glucose-Capillary: 116 mg/dL — ABNORMAL HIGH (ref 70–99)

## 2020-04-01 SURGERY — PHACOEMULSIFICATION, CATARACT, WITH IOL INSERTION
Anesthesia: Monitor Anesthesia Care | Site: Eye | Laterality: Left

## 2020-04-01 MED ORDER — MIDAZOLAM HCL 2 MG/2ML IJ SOLN
INTRAMUSCULAR | Status: DC | PRN
  Administered 2020-04-01: 1 mg via INTRAVENOUS

## 2020-04-01 MED ORDER — NA HYALUR & NA CHOND-NA HYALUR 0.4-0.35 ML IO KIT
PACK | INTRAOCULAR | Status: DC | PRN
  Administered 2020-04-01: 1 mL via INTRAOCULAR

## 2020-04-01 MED ORDER — FENTANYL CITRATE (PF) 100 MCG/2ML IJ SOLN
INTRAMUSCULAR | Status: DC | PRN
  Administered 2020-04-01: 50 ug via INTRAVENOUS

## 2020-04-01 MED ORDER — LACTATED RINGERS IV SOLN: INTRAVENOUS | Status: DC

## 2020-04-01 MED ORDER — EPINEPHRINE PF 1 MG/ML IJ SOLN
INTRAOCULAR | Status: DC | PRN
  Administered 2020-04-01: 50 mL via OPHTHALMIC

## 2020-04-01 MED ORDER — BRIMONIDINE TARTRATE-TIMOLOL 0.2-0.5 % OP SOLN
OPHTHALMIC | Status: DC | PRN
  Administered 2020-04-01: 1 [drp] via OPHTHALMIC

## 2020-04-01 MED ORDER — TETRACAINE HCL 0.5 % OP SOLN
1.0000 [drp] | OPHTHALMIC | Status: DC | PRN
  Administered 2020-04-01 (×3): 1 [drp] via OPHTHALMIC

## 2020-04-01 MED ORDER — LIDOCAINE HCL (PF) 2 % IJ SOLN
INTRAOCULAR | Status: DC | PRN
  Administered 2020-04-01: 1 mL

## 2020-04-01 MED ORDER — ARMC OPHTHALMIC DILATING DROPS
1.0000 "application " | OPHTHALMIC | Status: DC | PRN
  Administered 2020-04-01 (×3): 1 via OPHTHALMIC

## 2020-04-01 MED ORDER — CEFUROXIME OPHTHALMIC INJECTION 1 MG/0.1 ML
INJECTION | OPHTHALMIC | Status: DC | PRN
  Administered 2020-04-01: 0.1 mL via INTRACAMERAL

## 2020-04-01 SURGICAL SUPPLY — 22 items
CANNULA ANT/CHMB 27G (MISCELLANEOUS) ×1 IMPLANT
CANNULA ANT/CHMB 27GA (MISCELLANEOUS) ×2 IMPLANT
GLOVE SURG LX 7.5 STRW (GLOVE) ×1
GLOVE SURG LX STRL 7.5 STRW (GLOVE) ×1 IMPLANT
GLOVE SURG TRIUMPH 8.0 PF LTX (GLOVE) ×2 IMPLANT
GOWN STRL REUS W/ TWL LRG LVL3 (GOWN DISPOSABLE) ×2 IMPLANT
GOWN STRL REUS W/TWL LRG LVL3 (GOWN DISPOSABLE) ×4
LENS IOL TECNIS EYHANCE 19.5 (Intraocular Lens) ×1 IMPLANT
MARKER SKIN DUAL TIP RULER LAB (MISCELLANEOUS) ×2 IMPLANT
NDL CAPSULORHEX 25GA (NEEDLE) ×1 IMPLANT
NDL FILTER BLUNT 18X1 1/2 (NEEDLE) ×2 IMPLANT
NEEDLE CAPSULORHEX 25GA (NEEDLE) ×2 IMPLANT
NEEDLE FILTER BLUNT 18X 1/2SAF (NEEDLE) ×2
NEEDLE FILTER BLUNT 18X1 1/2 (NEEDLE) ×2 IMPLANT
PACK CATARACT BRASINGTON (MISCELLANEOUS) ×2 IMPLANT
PACK EYE AFTER SURG (MISCELLANEOUS) ×2 IMPLANT
PACK OPTHALMIC (MISCELLANEOUS) ×2 IMPLANT
SOLUTION OPHTHALMIC SALT (MISCELLANEOUS) ×2 IMPLANT
SYR 3ML LL SCALE MARK (SYRINGE) ×4 IMPLANT
SYR TB 1ML LUER SLIP (SYRINGE) ×2 IMPLANT
WATER STERILE IRR 250ML POUR (IV SOLUTION) ×2 IMPLANT
WIPE NON LINTING 3.25X3.25 (MISCELLANEOUS) ×2 IMPLANT

## 2020-04-01 NOTE — Anesthesia Preprocedure Evaluation (Signed)
Anesthesia Evaluation    History of Anesthesia Complications Negative for: history of anesthetic complications  Airway Mallampati: II  TM Distance: >3 FB Neck ROM: Full    Dental no notable dental hx.    Pulmonary sleep apnea and Continuous Positive Airway Pressure Ventilation ,    Pulmonary exam normal        Cardiovascular Exercise Tolerance: Good negative cardio ROS Normal cardiovascular exam     Neuro/Psych negative neurological ROS     GI/Hepatic negative GI ROS, Neg liver ROS,   Endo/Other  diabetes, Type 2Hypothyroidism Morbid obesity (BMI 41)  Renal/GU negative Renal ROS     Musculoskeletal   Abdominal   Peds  Hematology negative hematology ROS (+)   Anesthesia Other Findings   Reproductive/Obstetrics                             Anesthesia Physical Anesthesia Plan  ASA: III  Anesthesia Plan: MAC   Post-op Pain Management:    Induction: Intravenous  PONV Risk Score and Plan: 2 and TIVA, Midazolam and Treatment may vary due to age or medical condition  Airway Management Planned: Nasal Cannula and Natural Airway  Additional Equipment: None  Intra-op Plan:   Post-operative Plan:   Informed Consent: I have reviewed the patients History and Physical, chart, labs and discussed the procedure including the risks, benefits and alternatives for the proposed anesthesia with the patient or authorized representative who has indicated his/her understanding and acceptance.       Plan Discussed with: CRNA  Anesthesia Plan Comments:         Anesthesia Quick Evaluation

## 2020-04-01 NOTE — Anesthesia Postprocedure Evaluation (Signed)
Anesthesia Post Note  Patient: Wanda Ryan  Procedure(s) Performed: CATARACT EXTRACTION PHACO AND INTRAOCULAR LENS PLACEMENT (IOC) LEFT DIABETIC 8.67 01:11.5 12.1% (Left Eye)     Patient location during evaluation: PACU Anesthesia Type: MAC Level of consciousness: awake and alert Pain management: pain level controlled Vital Signs Assessment: post-procedure vital signs reviewed and stable Respiratory status: spontaneous breathing Cardiovascular status: blood pressure returned to baseline Postop Assessment: no apparent nausea or vomiting, adequate PO intake and no headache Anesthetic complications: no   No complications documented.  Adele Barthel Boyce Keltner

## 2020-04-01 NOTE — Transfer of Care (Signed)
Immediate Anesthesia Transfer of Care Note  Patient: Wanda Ryan  Procedure(s) Performed: CATARACT EXTRACTION PHACO AND INTRAOCULAR LENS PLACEMENT (IOC) LEFT DIABETIC 8.67 01:11.5 12.1% (Left Eye)  Patient Location: PACU  Anesthesia Type: MAC  Level of Consciousness: awake, alert  and patient cooperative  Airway and Oxygen Therapy: Patient Spontanous Breathing and Patient connected to supplemental oxygen  Post-op Assessment: Post-op Vital signs reviewed, Patient's Cardiovascular Status Stable, Respiratory Function Stable, Patent Airway and No signs of Nausea or vomiting  Post-op Vital Signs: Reviewed and stable  Complications: No complications documented.

## 2020-04-01 NOTE — Anesthesia Procedure Notes (Signed)
Procedure Name: MAC Date/Time: 04/01/2020 7:46 AM Performed by: Silvana Newness, CRNA Pre-anesthesia Checklist: Patient identified, Emergency Drugs available, Suction available, Patient being monitored and Timeout performed Patient Re-evaluated:Patient Re-evaluated prior to induction Oxygen Delivery Method: Nasal cannula Placement Confirmation: positive ETCO2

## 2020-04-01 NOTE — H&P (Signed)

## 2020-04-01 NOTE — Op Note (Signed)
OPERATIVE NOTE  Wanda Ryan [id number] 04/01/2020   PREOPERATIVE DIAGNOSIS:  Nuclear sclerotic cataract left eye. H25.12   POSTOPERATIVE DIAGNOSIS:    Nuclear sclerotic cataract left eye.     PROCEDURE:  Phacoemusification with posterior chamber intraocular lens placement of the left eye  Ultrasound time: Procedure(s): CATARACT EXTRACTION PHACO AND INTRAOCULAR LENS PLACEMENT (IOC) LEFT DIABETIC 8.67 01:11.5 12.1% (Left)  LENS:   Implant Name Type Inv. Item Serial No. Manufacturer Lot No. LRB No. Used Action  LENS IOL TECNIS EYHANCE 19.5 - [id number] Intraocular Lens LENS IOL TECNIS EYHANCE 19.5 [id number] JOHNSON   Left 1 Implanted      SURGEON:  Deirdre Evener, MD   ANESTHESIA:  Topical with tetracaine drops and 2% Xylocaine jelly, augmented with 1% preservative-free intracameral lidocaine.    COMPLICATIONS:  None.   DESCRIPTION OF PROCEDURE:  The patient was identified in the holding room and transported to the operating room and placed in the supine position under the operating microscope.  The left eye was identified as the operative eye and it was prepped and draped in the usual sterile ophthalmic fashion.   A 1 millimeter clear-corneal paracentesis was made at the 1:30 position.  0.5 ml of preservative-free 1% lidocaine was injected into the anterior chamber.  The anterior chamber was filled with Viscoat viscoelastic.  A 2.4 millimeter keratome was used to make a near-clear corneal incision at the 10:30 position.  .  A curvilinear capsulorrhexis was made with a cystotome and capsulorrhexis forceps.  Balanced salt solution was used to hydrodissect and hydrodelineate the nucleus.   Phacoemulsification was then used in stop and chop fashion to remove the lens nucleus and epinucleus.  The remaining cortex was then removed using the irrigation and aspiration handpiece. Provisc was then placed into the capsular bag to distend it for lens placement.  A lens was then  injected into the capsular bag.  The remaining viscoelastic was aspirated.   Wounds were hydrated with balanced salt solution.  The anterior chamber was inflated to a physiologic pressure with balanced salt solution.  No wound leaks were noted. Cefuroxime 0.1 ml of a 10mg /ml solution was injected into the anterior chamber for a dose of 1 mg of intracameral antibiotic at the completion of the case.   Timolol and Brimonidine drops were applied to the eye.  The patient was taken to the recovery room in stable condition without complications of anesthesia or surgery.  [name] 04/01/2020, 8:01 AM

## 2020-04-02 ENCOUNTER — Encounter: Payer: Self-pay | Admitting: Ophthalmology

## 2020-04-13 ENCOUNTER — Other Ambulatory Visit: Payer: Self-pay | Admitting: Obstetrics and Gynecology

## 2020-04-13 DIAGNOSIS — Z1231 Encounter for screening mammogram for malignant neoplasm of breast: Secondary | ICD-10-CM

## 2020-04-21 ENCOUNTER — Other Ambulatory Visit: Payer: Self-pay

## 2020-04-21 ENCOUNTER — Encounter: Payer: Self-pay | Admitting: Ophthalmology

## 2020-04-27 ENCOUNTER — Other Ambulatory Visit
Admission: RE | Admit: 2020-04-27 | Discharge: 2020-04-27 | Disposition: A | Discharge status: Home / Self Care | Payer: Managed Care, Other (non HMO) | Source: Ambulatory Visit | Attending: Ophthalmology | Admitting: Ophthalmology

## 2020-04-27 ENCOUNTER — Other Ambulatory Visit: Payer: Self-pay

## 2020-04-27 DIAGNOSIS — Z01812 Encounter for preprocedural laboratory examination: Secondary | ICD-10-CM | POA: Insufficient documentation

## 2020-04-27 DIAGNOSIS — Z20822 Contact with and (suspected) exposure to covid-19: Secondary | ICD-10-CM | POA: Diagnosis not present

## 2020-04-27 LAB — SARS CORONAVIRUS 2 (TAT 6-24 HRS): SARS Coronavirus 2: NEGATIVE

## 2020-04-27 NOTE — Discharge Instructions (Signed)

## 2020-04-29 ENCOUNTER — Ambulatory Visit: Payer: Managed Care, Other (non HMO) | Admitting: Anesthesiology

## 2020-04-29 ENCOUNTER — Other Ambulatory Visit: Payer: Self-pay

## 2020-04-29 ENCOUNTER — Ambulatory Visit
Admission: RE | Admit: 2020-04-29 | Discharge: 2020-04-29 | Disposition: A | Discharge status: Home / Self Care | Payer: Managed Care, Other (non HMO) | Attending: Ophthalmology | Admitting: Ophthalmology

## 2020-04-29 ENCOUNTER — Encounter
Admission: RE | Disposition: A | Discharge status: Home / Self Care | Payer: Self-pay | Source: Home / Self Care | Attending: Ophthalmology

## 2020-04-29 ENCOUNTER — Encounter: Payer: Self-pay | Admitting: Ophthalmology

## 2020-04-29 DIAGNOSIS — Z8616 Personal history of COVID-19: Secondary | ICD-10-CM | POA: Diagnosis not present

## 2020-04-29 DIAGNOSIS — Z79899 Other long term (current) drug therapy: Secondary | ICD-10-CM | POA: Diagnosis not present

## 2020-04-29 DIAGNOSIS — E119 Type 2 diabetes mellitus without complications: Secondary | ICD-10-CM | POA: Diagnosis not present

## 2020-04-29 DIAGNOSIS — H2511 Age-related nuclear cataract, right eye: Secondary | ICD-10-CM | POA: Diagnosis present

## 2020-04-29 HISTORY — PX: CATARACT EXTRACTION W/PHACO: SHX586

## 2020-04-29 LAB — GLUCOSE, CAPILLARY
Glucose-Capillary: 105 mg/dL — ABNORMAL HIGH (ref 70–99)
Glucose-Capillary: 132 mg/dL — ABNORMAL HIGH (ref 70–99)

## 2020-04-29 SURGERY — PHACOEMULSIFICATION, CATARACT, WITH IOL INSERTION
Anesthesia: Monitor Anesthesia Care | Site: Eye | Laterality: Right

## 2020-04-29 MED ORDER — LACTATED RINGERS IV SOLN: INTRAVENOUS | Status: DC

## 2020-04-29 MED ORDER — TETRACAINE HCL 0.5 % OP SOLN
1.0000 [drp] | OPHTHALMIC | Status: DC | PRN
  Administered 2020-04-29 (×3): 1 [drp] via OPHTHALMIC

## 2020-04-29 MED ORDER — ARMC OPHTHALMIC DILATING DROPS
1.0000 "application " | OPHTHALMIC | Status: DC | PRN
  Administered 2020-04-29 (×3): 1 via OPHTHALMIC

## 2020-04-29 MED ORDER — LIDOCAINE HCL (PF) 2 % IJ SOLN
INTRAOCULAR | Status: DC | PRN
  Administered 2020-04-29: 1 mL

## 2020-04-29 MED ORDER — MIDAZOLAM HCL 2 MG/2ML IJ SOLN
INTRAMUSCULAR | Status: DC | PRN
  Administered 2020-04-29: 2 mg via INTRAVENOUS

## 2020-04-29 MED ORDER — CEFUROXIME OPHTHALMIC INJECTION 1 MG/0.1 ML
INJECTION | OPHTHALMIC | Status: DC | PRN
  Administered 2020-04-29: 0.1 mL via INTRACAMERAL

## 2020-04-29 MED ORDER — EPINEPHRINE PF 1 MG/ML IJ SOLN
INTRAOCULAR | Status: DC | PRN
  Administered 2020-04-29: 56 mL via OPHTHALMIC

## 2020-04-29 MED ORDER — ACETAMINOPHEN 160 MG/5ML PO SOLN: 325.0000 mg | ORAL | Status: DC | PRN

## 2020-04-29 MED ORDER — FENTANYL CITRATE (PF) 100 MCG/2ML IJ SOLN
INTRAMUSCULAR | Status: DC | PRN
  Administered 2020-04-29 (×2): 50 ug via INTRAVENOUS

## 2020-04-29 MED ORDER — ACETAMINOPHEN 325 MG PO TABS
325.0000 mg | ORAL_TABLET | ORAL | Status: DC | PRN
Start: 2020-04-29 — End: 2020-04-29

## 2020-04-29 MED ORDER — NA HYALUR & NA CHOND-NA HYALUR 0.4-0.35 ML IO KIT
PACK | INTRAOCULAR | Status: DC | PRN
  Administered 2020-04-29: 1 mL via INTRAOCULAR

## 2020-04-29 MED ORDER — ONDANSETRON HCL 4 MG/2ML IJ SOLN: 4.0000 mg | Freq: Once | INTRAMUSCULAR | Status: DC | PRN

## 2020-04-29 MED ORDER — BRIMONIDINE TARTRATE-TIMOLOL 0.2-0.5 % OP SOLN
OPHTHALMIC | Status: DC | PRN
  Administered 2020-04-29: 1 [drp] via OPHTHALMIC

## 2020-04-29 SURGICAL SUPPLY — 22 items
CANNULA ANT/CHMB 27G (MISCELLANEOUS) ×1 IMPLANT
CANNULA ANT/CHMB 27GA (MISCELLANEOUS) ×2 IMPLANT
GLOVE SURG LX 7.5 STRW (GLOVE) ×1
GLOVE SURG LX STRL 7.5 STRW (GLOVE) ×1 IMPLANT
GLOVE SURG TRIUMPH 8.0 PF LTX (GLOVE) ×2 IMPLANT
GOWN STRL REUS W/ TWL LRG LVL3 (GOWN DISPOSABLE) ×2 IMPLANT
GOWN STRL REUS W/TWL LRG LVL3 (GOWN DISPOSABLE) ×4
LENS IOL TECNIS EYHANCE 18.5 (Intraocular Lens) ×1 IMPLANT
MARKER SKIN DUAL TIP RULER LAB (MISCELLANEOUS) ×2 IMPLANT
NDL CAPSULORHEX 25GA (NEEDLE) ×1 IMPLANT
NDL FILTER BLUNT 18X1 1/2 (NEEDLE) ×2 IMPLANT
NEEDLE CAPSULORHEX 25GA (NEEDLE) ×2 IMPLANT
NEEDLE FILTER BLUNT 18X 1/2SAF (NEEDLE) ×2
NEEDLE FILTER BLUNT 18X1 1/2 (NEEDLE) ×2 IMPLANT
PACK CATARACT BRASINGTON (MISCELLANEOUS) ×2 IMPLANT
PACK EYE AFTER SURG (MISCELLANEOUS) ×2 IMPLANT
PACK OPTHALMIC (MISCELLANEOUS) ×2 IMPLANT
SOLUTION OPHTHALMIC SALT (MISCELLANEOUS) ×2 IMPLANT
SYR 3ML LL SCALE MARK (SYRINGE) ×4 IMPLANT
SYR TB 1ML LUER SLIP (SYRINGE) ×2 IMPLANT
WATER STERILE IRR 250ML POUR (IV SOLUTION) ×2 IMPLANT
WIPE NON LINTING 3.25X3.25 (MISCELLANEOUS) ×2 IMPLANT

## 2020-04-29 NOTE — Op Note (Signed)
LOCATION:  [facility]   PREOPERATIVE DIAGNOSIS:    Nuclear sclerotic cataract right eye. H25.11   POSTOPERATIVE DIAGNOSIS:  Nuclear sclerotic cataract right eye.     PROCEDURE:  Phacoemusification with posterior chamber intraocular lens placement of the right eye   ULTRASOUND TIME: Procedure(s) with comments: CATARACT EXTRACTION PHACO AND INTRAOCULAR LENS PLACEMENT (IOC) RIGHT DIABETIC 5.23 00:51.1 10.2% (Right) - Diabetic - oral med  LENS:   Implant Name Type Inv. Item Serial No. Manufacturer Lot No. LRB No. Used Action  LENS IOL TECNIS EYHANCE 18.5 - [id number] Intraocular Lens LENS IOL TECNIS EYHANCE 18.5 [id number] JOHNSON   Right 1 Implanted         SURGEON:  Deirdre Evener, MD   ANESTHESIA:  Topical with tetracaine drops and 2% Xylocaine jelly, augmented with 1% preservative-free intracameral lidocaine.    COMPLICATIONS:  None.   DESCRIPTION OF PROCEDURE:  The patient was identified in the holding room and transported to the operating room and placed in the supine position under the operating microscope.  The right eye was identified as the operative eye and it was prepped and draped in the usual sterile ophthalmic fashion.   A 1 millimeter clear-corneal paracentesis was made at the 12:00 position.  0.5 ml of preservative-free 1% lidocaine was injected into the anterior chamber. The anterior chamber was filled with Viscoat viscoelastic.  A 2.4 millimeter keratome was used to make a near-clear corneal incision at the 9:00 position.  A curvilinear capsulorrhexis was made with a cystotome and capsulorrhexis forceps.  Balanced salt solution was used to hydrodissect and hydrodelineate the nucleus.   Phacoemulsification was then used in stop and chop fashion to remove the lens nucleus and epinucleus.  The remaining cortex was then removed using the irrigation and aspiration handpiece. Provisc was then placed into the capsular bag to distend it for lens placement.  A  lens was then injected into the capsular bag.  The remaining viscoelastic was aspirated.   Wounds were hydrated with balanced salt solution.  The anterior chamber was inflated to a physiologic pressure with balanced salt solution.  No wound leaks were noted. Cefuroxime 0.1 ml of a 10mg /ml solution was injected into the anterior chamber for a dose of 1 mg of intracameral antibiotic at the completion of the case.   Timolol and Brimonidine drops were applied to the eye.  The patient was taken to the recovery room in stable condition without complications of anesthesia or surgery.   Gazelle Towe 04/29/2020, 8:06 AM

## 2020-04-29 NOTE — Anesthesia Preprocedure Evaluation (Signed)
Anesthesia Evaluation  Patient identified by MRN, date of birth, ID band Patient confused and Patient unresponsive    History of Anesthesia Complications Negative for: history of anesthetic complications  Airway Mallampati: II  TM Distance: >3 FB Neck ROM: Full    Dental no notable dental hx.    Pulmonary sleep apnea and Continuous Positive Airway Pressure Ventilation ,    Pulmonary exam normal breath sounds clear to auscultation       Cardiovascular Exercise Tolerance: Good negative cardio ROS Normal cardiovascular exam Rhythm:Regular Rate:Normal     Neuro/Psych PSYCHIATRIC DISORDERS Depression negative neurological ROS     GI/Hepatic negative GI ROS, Neg liver ROS,   Endo/Other  diabetes, Type 2Hypothyroidism Morbid obesity (BMI 41)  Renal/GU negative Renal ROS     Musculoskeletal  (+) Arthritis ,   Abdominal (+) + obese,   Peds  Hematology negative hematology ROS (+)   Anesthesia Other Findings   Reproductive/Obstetrics                             Anesthesia Physical  Anesthesia Plan  ASA: III  Anesthesia Plan: MAC   Post-op Pain Management:    Induction: Intravenous  PONV Risk Score and Plan: 2 and TIVA, Midazolam and Treatment may vary due to age or medical condition  Airway Management Planned: Nasal Cannula and Natural Airway  Additional Equipment: None  Intra-op Plan:   Post-operative Plan:   Informed Consent: I have reviewed the patients History and Physical, chart, labs and discussed the procedure including the risks, benefits and alternatives for the proposed anesthesia with the patient or authorized representative who has indicated his/her understanding and acceptance.       Plan Discussed with: CRNA  Anesthesia Plan Comments:         Anesthesia Quick Evaluation  There are no problems to display for this patient.   No flowsheet data found. No  flowsheet data found.  Risks and benefits of anesthesia discussed at length, patient or surrogate demonstrates understanding. Appropriately NPO. Plan to proceed with anesthesia.  Champ Mungo, MD 04/29/20

## 2020-04-29 NOTE — Transfer of Care (Signed)
Immediate Anesthesia Transfer of Care Note  Patient: Wanda Ryan  Procedure(s) Performed: CATARACT EXTRACTION PHACO AND INTRAOCULAR LENS PLACEMENT (IOC) RIGHT DIABETIC 5.23 00:51.1 10.2% (Right Eye)  Patient Location: PACU  Anesthesia Type: MAC  Level of Consciousness: awake, alert  and patient cooperative  Airway and Oxygen Therapy: Patient Spontanous Breathing and Patient connected to supplemental oxygen  Post-op Assessment: Post-op Vital signs reviewed, Patient's Cardiovascular Status Stable, Respiratory Function Stable, Patent Airway and No signs of Nausea or vomiting  Post-op Vital Signs: Reviewed and stable  Complications: No complications documented.

## 2020-04-29 NOTE — Anesthesia Postprocedure Evaluation (Signed)
Anesthesia Post Note  Patient: Wanda Ryan  Procedure(s) Performed: CATARACT EXTRACTION PHACO AND INTRAOCULAR LENS PLACEMENT (IOC) RIGHT DIABETIC 5.23 00:51.1 10.2% (Right Eye)     Patient location during evaluation: PACU Anesthesia Type: MAC Level of consciousness: awake and alert Pain management: pain level controlled Vital Signs Assessment: post-procedure vital signs reviewed and stable Respiratory status: spontaneous breathing, nonlabored ventilation, respiratory function stable and patient connected to nasal cannula oxygen Cardiovascular status: stable and blood pressure returned to baseline Postop Assessment: no apparent nausea or vomiting Anesthetic complications: no   No complications documented.  Sinda Du

## 2020-04-29 NOTE — Anesthesia Procedure Notes (Signed)
Procedure Name: MAC Date/Time: 04/29/2020 7:48 AM Performed by: Jeannene Patella, CRNA Pre-anesthesia Checklist: Patient identified, Emergency Drugs available, Suction available, Timeout performed and Patient being monitored Patient Re-evaluated:Patient Re-evaluated prior to induction Oxygen Delivery Method: Nasal cannula Placement Confirmation: positive ETCO2

## 2020-04-29 NOTE — H&P (Signed)

## 2020-04-30 ENCOUNTER — Encounter: Payer: Self-pay | Admitting: Ophthalmology

## 2021-05-23 DIAGNOSIS — M170 Bilateral primary osteoarthritis of knee: Secondary | ICD-10-CM | POA: Insufficient documentation

## 2021-07-04 NOTE — Discharge Instructions (Signed)
Instructions after Total Knee Replacement   Rosea Dory P. Deborha Moseley, Jr., M.D.     Dept. of Orthopaedics & Sports Medicine  [facility]  [address]  [place], [place]  [address]  Phone: [phone]   Fax: [phone]    DIET: Drink plenty of non-alcoholic fluids. Resume your normal diet. Include foods high in fiber.  ACTIVITY:  You may use crutches or a walker with weight-bearing as tolerated, unless instructed otherwise. You may be weaned off of the walker or crutches by your Physical Therapist.  Do NOT place pillows under the knee. Anything placed under the knee could limit your ability to straighten the knee.   Continue doing gentle exercises. Exercising will reduce the pain and swelling, increase motion, and prevent muscle weakness.   Please continue to use the TED compression stockings for 6 weeks. You may remove the stockings at night, but should reapply them in the morning. Do not drive or operate any equipment until instructed.  WOUND CARE:  Continue to use the PolarCare or ice packs periodically to reduce pain and swelling. You may bathe or shower after the staples are removed at the first office visit following surgery.  MEDICATIONS: You may resume your regular medications. Please take the pain medication as prescribed on the medication. Do not take pain medication on an empty stomach. You have been given a prescription for a blood thinner (Lovenox or Coumadin). Please take the medication as instructed. (NOTE: After completing a 2 week course of Lovenox, take one Enteric-coated aspirin once a day. This along with elevation will help reduce the possibility of phlebitis in your operated leg.) Do not drive or drink alcoholic beverages when taking pain medications.  CALL THE OFFICE FOR: Temperature above 101 degrees Excessive bleeding or drainage on the dressing. Excessive swelling, coldness, or paleness of the toes. Persistent nausea and vomiting.  FOLLOW-UP:  You  should have an appointment to return to the office in 10-14 days after surgery. Arrangements have been made for continuation of Physical Therapy (either home therapy or outpatient therapy).   [facility] Department Directory         [email or web address]       [email or web address]          Cardiology  Appointments: [place] - [phone] [place] - [phone]  Endocrinology  Appointments: [place] - [phone] [place] - [phone]  Gastroenterology  Appointments: [place] - [phone] [place] - [phone]        General Surgery   Appointments: [place] - [phone]  Internal Medicine/Family Medicine  Appointments: [place] - [phone] [place] - [phone] [place] - [phone]  Metabolic and Weigh Loss Surgery  Appointments: [place] - [phone]        Neurology  Appointments: [place] - [phone] [place] - [phone]  Neurosurgery  Appointments: [place] - [phone]  Obstetrics & Gynecology  Appointments: [place] - [phone] [place] - [phone]        Pediatrics  Appointments: [place] - [phone] [place] - [phone]  Physiatry  Appointments: [place] -[phone]  Physical Therapy  Appointments: [place] - [phone] [place] - [phone]        Podiatry  Appointments: [place] - [phone] [place] - [phone]  Pulmonology  Appointments: [place] - [phone]  Rheumatology  Appointments: [place] - [phone]        [place] Location: [facility]  [address] [place], [place]  [address]  [place] Location: [facility] [address] [place], [place]  [address]  [place] Location: [facility] [address] [place], [place]  [address]    

## 2021-07-15 ENCOUNTER — Encounter
Admission: RE | Admit: 2021-07-15 | Discharge: 2021-07-15 | Disposition: A | Discharge status: Home / Self Care | Payer: Medicare HMO | Source: Ambulatory Visit | Attending: Orthopedic Surgery | Admitting: Orthopedic Surgery

## 2021-07-15 VITALS — BP 129/64 | HR 78 | Temp 98.2°F | Resp 16 | Ht 63.0 in | Wt 233.6 lb

## 2021-07-15 DIAGNOSIS — R8290 Unspecified abnormal findings in urine: Secondary | ICD-10-CM | POA: Insufficient documentation

## 2021-07-15 DIAGNOSIS — Z01812 Encounter for preprocedural laboratory examination: Secondary | ICD-10-CM

## 2021-07-15 DIAGNOSIS — M1712 Unilateral primary osteoarthritis, left knee: Secondary | ICD-10-CM | POA: Insufficient documentation

## 2021-07-15 DIAGNOSIS — E119 Type 2 diabetes mellitus without complications: Secondary | ICD-10-CM | POA: Diagnosis not present

## 2021-07-15 DIAGNOSIS — Z01818 Encounter for other preprocedural examination: Secondary | ICD-10-CM | POA: Insufficient documentation

## 2021-07-15 HISTORY — DX: Personal history of urinary calculi: Z87.442

## 2021-07-15 LAB — URINALYSIS, ROUTINE W REFLEX MICROSCOPIC
Bilirubin Urine: NEGATIVE
Glucose, UA: NEGATIVE mg/dL
Ketones, ur: NEGATIVE mg/dL
Leukocytes,Ua: NEGATIVE
Nitrite: POSITIVE — AB
Protein, ur: NEGATIVE mg/dL
Specific Gravity, Urine: 1.023 (ref 1.005–1.030)
pH: 6 (ref 5.0–8.0)

## 2021-07-15 LAB — TYPE AND SCREEN
ABO/RH(D): O POS
Antibody Screen: NEGATIVE

## 2021-07-15 LAB — SURGICAL PCR SCREEN
MRSA, PCR: NEGATIVE
Staphylococcus aureus: NEGATIVE

## 2021-07-15 LAB — C-REACTIVE PROTEIN: CRP: 2.6 mg/dL — ABNORMAL HIGH (ref <1.0)

## 2021-07-15 LAB — SEDIMENTATION RATE: Sed Rate: 40 mm/hr — ABNORMAL HIGH (ref 0–30)

## 2021-07-15 NOTE — Patient Instructions (Addendum)
?Your procedure is scheduled on: Wednesday July 28, 2021. ?Report to Day Surgery inside [facility] 2nd floor, stop by admissions desk before getting on elevator.  ?To find out your arrival time please call [phone] between 1PM - 3PM on Tuesday July 27, 2021. ? ?Remember: Instructions that are not followed completely may result in serious medical risk,  ?up to and including death, or upon the discretion of your surgeon and anesthesiologist your  ?surgery may need to be rescheduled.  ? ?  _X__ 1. Do not eat food after midnight the night before your procedure. ?                No chewing gum or hard candies. You may drink clear liquids up to 2 hours ?                before you are scheduled to arrive for your surgery- DO not drink clear ?                liquids within 2 hours of the start of your surgery. ?                Clear Liquids include:  water, apple juice without pulp, clear Gatorade, G2 or  ?                Gatorade Zero (avoid Red/Purple/Blue), Black Coffee or Tea (Do not add ?                anything to coffee or tea). ? ?___X__2.   Complete the "Ensure Clear Pre-surgery Clear Carbohydrate Drink" provided to you, 2 hours before arrival. **If you are diabetic you will be provided with an alternative drink, Gatorade Zero or G2. ? ?__X__2.  On the morning of surgery brush your teeth with toothpaste and water, you ?               may rinse your mouth with mouthwash if you wish.  Do not swallow any toothpaste or mouthwash. ?   ? _X__ 3.  No Alcohol for 24 hours before or after surgery. ? ? _X__ 4.  Do Not Smoke or use e-cigarettes For 24 Hours Prior to Your Surgery. ?                Do not use any chewable tobacco products for at least 6 hours prior to ?                Surgery. ? ?_X__  5.  Do not use any recreational drugs (marijuana, cocaine, heroin, ecstasy, MDMA or other) ?               For at least one week prior to your surgery.  Combination of these drugs with anesthesia ?                May have life threatening results. ? ?____  6.  Bring all medications with you on the day of surgery if instructed.  ? ?__X__  7.  Notify your doctor if there is any change in your medical condition  ?    (cold, fever, infections). ?    ?Do not wear jewelry, make-up, hairpins, clips or nail polish. ?Do not wear lotions, powders, or perfumes. You may wear deodorant. ?Do not shave 48 hours prior to surgery. Men may shave face and neck. ?Do not bring valuables to the hospital.   ? ?[facility] is not responsible for any belongings or valuables. ? ?  Contacts, dentures or bridgework may not be worn into surgery. ?Leave your suitcase in the car. After surgery it may be brought to your room. ?For patients admitted to the hospital, discharge time is determined by your ?treatment team. ?  ?Patients discharged the day of surgery will not be allowed to drive home.   ?Make arrangements for someone to be with you for the first 24 hours of your ?Same Day Discharge. ? ? ?__X__ Take these medicines the morning of surgery with A SIP OF WATER:  ? ? 1. levothyroxine (SYNTHROID) 100 MCG  ? 2.  ? 3.  ? 4. ? 5. ? 6. ? ?____ Fleet Enema (as directed)  ? ?__X__ Use CHG Soap (or wipes) as directed ? ?____ Use Benzoyl Peroxide Gel as instructed ? ?____ Use inhalers on the day of surgery ? ?____ Stop metformin 2 days prior to surgery   ? ?____ Take 1/2 of usual insulin dose the night before surgery. No insulin the morning ?         of surgery.  ? ?__X_ Stop aspirin 81mg  as instructed by your doctor, before your surgery.  ? ?__X__ One Week prior to surgery- Stop Anti-inflammatories such as Ibuprofen, Aleve, Advil, Motrin, meloxicam (MOBIC), diclofenac, etodolac, ketorolac, Toradol, Daypro, piroxicam, Goody's or BC powders. OK TO USE TYLENOL IF NEEDED ?  ?__X__ Do not star any vitamins and or herbal supplements until after surgery.   ? ?____ Bring C-Pap to the hospital.  ? ? ?If you have any questions regarding your pre-procedure  instructions,  ?Please call Pre-admit Testing at [phone] ?

## 2021-07-16 ENCOUNTER — Other Ambulatory Visit: Payer: Self-pay | Admitting: Family Medicine

## 2021-07-16 DIAGNOSIS — Z1231 Encounter for screening mammogram for malignant neoplasm of breast: Secondary | ICD-10-CM

## 2021-07-16 NOTE — Progress Notes (Signed)
?  [facility] ?Perioperative Services: Pre-Admission/Anesthesia Testing ? ?Abnormal Lab Notification  ?Date: 07/16/21 ? ?Name: Wanda Ryan ?MRN:   [id number] ? ?Re: Abnormal labs noted during PAT appointment  ? ?Notified:  ?Provider Name Provider Role Notification Mode  ?Skip Estimable, MD Orthopedics (Surgeon) Routed and/or faxed via [facility]  ? ?Abnormal Lab Value(s):  ? ?Lab Results  ?Component Value Date  ? COLORURINE AMBER (A) 07/15/2021  ? APPEARANCEUR CLOUDY (A) 07/15/2021  ? LABSPEC 1.023 07/15/2021  ? PHURINE 6.0 07/15/2021  ? GLUCOSEU NEGATIVE 07/15/2021  ? HGBUR SMALL (A) 07/15/2021  ? [place] NEGATIVE 07/15/2021  ? [place] NEGATIVE 07/15/2021  ? PROTEINUR NEGATIVE 07/15/2021  ? NITRITE POSITIVE (A) 07/15/2021  ? LEUKOCYTESUR NEGATIVE 07/15/2021  ? EPIU 6-10 07/15/2021  ? WBCU 6-10 07/15/2021  ? RBCU 0-5 07/15/2021  ? BACTERIA FEW (A) 07/15/2021  ? CULT (A) 07/15/2021  ?  >=100,000 COLONIES/mL GRAM NEGATIVE RODS ?IDENTIFICATION AND SUSCEPTIBILITIES TO FOLLOW ?Performed at [facility], [place] [address], [place], [place] [address] ?  ? ? ?Clinical Information and Notes:  ?Patient is scheduled for COMPUTER ASSISTED TOTAL KNEE ARTHROPLASTY (Left: Knee) on 07/28/2021.   ? ?UA performed in PAT consistent with infection.  ?No leukocytosis noted on CBC; WBC 8400 ?Renal function: BUN 14 and creatinine 0.9 mg/dL (eGFR 62) ?Urine C&S added to assess for pathogenically significant growth. ? ?Impression and Plan:  ?Wanda Ryan with a UA that was (+) for infection; reflex culture sent. (+) significant GNR colony count identified; final ID and susceptibilities pending at this time. Will forward UA, and subsequent C&S results, to attending surgeon for review and Tx as deemed appropriate.  ? ?This is a Community education officer; no formal response is required. ? ?Honor Loh, MSN, APRN, FNP-C, CEN ?[facility]  ?Peri-operative Services Nurse Practitioner ?Phone: [phone] ?Fax:  [phone] ?07/16/21 10:14 AM ? ?NOTE: This note has been prepared using [employer]. Despite my best ability to proofread, there is always the potential that unintentional transcriptional errors may still occur from this process. ?

## 2021-07-17 LAB — URINE CULTURE: Culture: >=100000 — AB

## 2021-07-25 ENCOUNTER — Encounter: Payer: Self-pay | Admitting: Orthopedic Surgery

## 2021-07-25 DIAGNOSIS — E039 Hypothyroidism, unspecified: Secondary | ICD-10-CM | POA: Insufficient documentation

## 2021-07-25 DIAGNOSIS — F32A Depression, unspecified: Secondary | ICD-10-CM | POA: Insufficient documentation

## 2021-07-25 DIAGNOSIS — G4733 Obstructive sleep apnea (adult) (pediatric): Secondary | ICD-10-CM | POA: Insufficient documentation

## 2021-07-25 NOTE — H&P (Signed)
ORTHOPAEDIC HISTORY & PHYSICAL  ?Tamala Julian [place], [place] - 07/15/2021 4:00 PM EDT ?Formatting of this note is different from the original. ?[facility] ?ORTHOPAEDICS AND SPORTS MEDICINE ?Chief Complaint:  ? ?Chief Complaint  ?Patient presents with  ? Knee Pain  ?H & P LEFT KNEE  ? ?History of Present Illness:  ? ?Wanda Ryan is a 71 y.o. female that presents to clinic today for her preoperative history and evaluation. Patient presents unaccompanied. The patient is scheduled to undergo a left total knee arthroplasty on 07/28/21 by Dr. Marry Guan. Her pain began several years ago. The pain is located along the medial aspect of the knee. She describes her pain as worse with weightbearing. She reports associated swelling with some giving way of the knee. She denies associated numbness or tingling, denies locking.  ? ?The patient's symptoms have progressed to the point that they decrease her quality of life. The patient has previously undergone conservative treatment including NSAIDS and injections to the knee without adequate control of her symptoms. ? ?Denies history of DVT, lumbar surgery, or significant cardiac history. Her husband will be at home to help post-operatively. ? ?Of note, patient is a type II diabetic. Last A1c 6.1 on 07/14/2021. ? ?Past Medical, Surgical, Family, Social History, Allergies, Medications:  ? ?Past Medical History:  ?Past Medical History:  ?Diagnosis Date  ? Carpal tunnel syndrome  ? Cervicalgia  ? Depression  ? Diabetes mellitus type 2, uncomplicated (CMS-HCC)  ? Hypothyroidism  ? Hypothyroidism (acquired), unspecified  ? Obesity  ? Pure hypercholesterolemia  ? Sleep apnea  ? Type 2 diabetes mellitus (CMS-HCC)  ? ?Past Surgical History:  ?Past Surgical History:  ?Procedure Laterality Date  ? EGD 09/07/2005  ?No repeat per RTE  ? COLONOSCOPY 05/07/2018  ?FHCP (Mother) CBF 04/2023  ? benign right breast biopsy  ? CARPAL TUNNEL RELEASE  ? carpal tunnel repair  ? CATARACT EXTRACTION  ?  CHOLECYSTECTOMY  ? COLONOSCOPY 09/19/2011, 09/07/2005  ?FH Colon Polyps (Mother): CBF 09/2016; Recall Ltr mailed 08/22/2016 (dw)  ? HYSTERECTOMY  ?ovaries still present  ? ?Current Medications:  ?Current Outpatient Medications  ?Medication Sig Dispense Refill  ? acetaminophen (TYLENOL) 500 MG tablet Take 1,000 mg by mouth every 8 (eight) hours as needed 30 tablet 0  ? aspirin 81 MG EC tablet Take 81 mg by mouth once daily.  ? blood glucose meter kit Use as directed (Check Blood Sugars fasting daily. Dx E11.69 ONE TOUCH) 1 each 0  ? calcium carbonate-vitamin D3 (CALTRATE 600+D) 600 mg(1,521m) -400 unit tablet Take 2 tablets by mouth once daily  ? citalopram (CELEXA) 20 MG tablet Take 1 tablet (20 mg total) by mouth once daily 90 tablet 1  ? gabapentin (NEURONTIN) 300 MG capsule Take 2 capsules (600 mg total) by mouth at bedtime for 90 days 180 capsule 1  ? glimepiride (AMARYL) 4 MG tablet Take 1 tablet (4 mg total) by mouth daily with breakfast 90 tablet 1  ? lancets Check Blood Sugars fasting daily. Dx E11.69 ONE TOUCH 100 each 1  ? levothyroxine (SYNTHROID) 100 MCG tablet Take 1 tablet (100 mcg total) by mouth every morning before breakfast 90 tablet 1  ? lisinopriL (ZESTRIL) 2.5 MG tablet Take 1 tablet (2.5 mg total) by mouth once daily 30 tablet 5  ? ONETOUCH DELICA PLUS LANCET 33 gauge Misc TEST FASTING BLOOD SUGAR DAILY 100 each 0  ? ONETOUCH ULTRA TEST test strip CHECK BLOOD SUGAR(S) FASTING DAILY AS DIRECTED 100 strip 1  ?  pravastatin (PRAVACHOL) 80 MG tablet Take 1 tablet (80 mg total) by mouth at bedtime 90 tablet 1  ? semaglutide (OZEMPIC) 0.25 mg or 0.5 mg(2 mg/1.5 mL) pen injector Inject 0.1875 mLs (0.25 mg total) subcutaneously once a week for 28 days, THEN 0.375 mLs (0.5 mg total) once a week for 28 days, THEN 0.75 mLs (1 mg total) once a week for 28 days, THEN 1.5 mLs (2 mg total) once a week for 28 days. 11.25 mL 0  ? semaglutide (OZEMPIC) 2 mg/dose (8 mg/3 mL) pen injector Inject 0.75 mLs (2 mg total)  subcutaneously once a week 9 mL 1  ? ?No current facility-administered medications for this visit.  ? ?Allergies:  ?Allergies  ?Allergen Reactions  ? Metformin Diarrhea  ? ?Social History:  ?Social History  ? ?Socioeconomic History  ? Marital status: Married  ?Spouse name: Barnabas Lister  ? Number of children: 1  ? Years of education: 35  ? Highest education level: High school graduate  ?Occupational History  ? Occupation: Full-time - [employer]  ?Tobacco Use  ? Smoking status: Never  ? Smokeless tobacco: Never  ?Vaping Use  ? Vaping Use: Never used  ?Substance and Sexual Activity  ? Alcohol use: No  ?Alcohol/week: 0.0 standard drinks  ? Drug use: No  ? 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 ?Family History:  ?Family History  ?Problem Relation Age of Onset  ? No Known Problems Mother  ? No Known Problems Father  ? No Known Problems Sister  ? No Known Problems Brother  ? Diabetes type II Maternal Grandmother  ? High blood pressure (Hypertension) Maternal Grandmother  ? Breast cancer Maternal Grandmother  ? ?Review of Systems:  ? ?A 10+ ROS was performed, reviewed, and the pertinent orthopaedic findings are documented in the HPI.  ? ?Physical Examination:  ? ?BP 122/70 (BP Location: Left upper arm, Patient Position: Sitting, BP Cuff Size: Large Adult)  Ht 160 cm (_0 )  Wt (!) 105.6 kg (232 lb 12.8 oz)  BMI 41.24 kg/m?  ? ?Patient is a well-developed, well-nourished female in no acute distress. Patient has normal mood and affect. Patient is alert and oriented to person, place, and time.  ? ?HEENT: Atraumatic, normocephalic. Pupils equal and reactive to light. Extraocular motion intact. Noninjected sclera. ? ?Cardiovascular: Regular rate and rhythm, with no  murmurs, rubs, or gallops. Distal pulses palpable. No bruits. ? ?Respiratory: Lungs clear to auscultation bilaterally.  ? ?Left Knee: ?Soft tissue swelling: mild ?Effusion: none ?Erythema: none ?Crepitance: mild ?Tenderness: medial ?Alignment: relative varus ?Mediolateral laxity: medial pseudolaxity ?Posterior sag: negative ?Patellar tracking: Good tracking without evidence of subluxation or tilt ?Atrophy: No significant atrophy.  ?Quadriceps tone was fair to good. ?Range of motion: 0/0/127 degrees ? ?Sensation intact over the saphenous, lateral sural cutaneous, superficial fibular, and deep fibular nerve distributions. ? ?Tests Performed/Reviewed:  ?X-rays ? ?3 views of the left knee were reviewed. Images reveal severe loss of medial compartment joint space with osteophyte formation. No fractures or dislocations. No other osseous abnormality noted ? ?Impression:  ? ?ICD-10-CM  ?1. Primary osteoarthritis of left knee M17.12  ?2. Type 2 diabetes mellitus with hyperlipidemia (CMS-HCC) E11.69  ?E78.5  ?3. Morbid obesity due to excess calories (CMS-HCC) E66.01  ? ?Plan:  ? ?  The patient has end-stage degenerative changes of the left knee. It was explained to the patient that the condition is progressive in nature. Having failed conservative treatment, the patient has elected to proceed with a total joint arthroplasty. The patient will undergo a total joint arthroplasty with Dr. Marry Guan. The risks of surgery, including blood clot and infection, were discussed with the patient. Measures to reduce these risks, including the use of anticoagulation, perioperative antibiotics, and early ambulation were discussed. The importance of postoperative physical therapy was discussed with the patient. The patient elects to proceed with surgery. The patient is instructed to stop all blood thinners prior to surgery. The patient is instructed to call the hospital the day before surgery to learn of the proper arrival time.  ? ?Contact our  office with any questions or concerns. Follow up as indicated, or sooner should any new problems arise, if conditions worsen, or if they are otherwise concerned.  ? ?Gwenlyn Fudge, PA -C ?[facility]

## 2021-07-26 ENCOUNTER — Other Ambulatory Visit: Payer: Managed Care, Other (non HMO)

## 2021-07-27 MED ORDER — TRANEXAMIC ACID-NACL 1000-0.7 MG/100ML-% IV SOLN
1000.0000 mg | INTRAVENOUS | Status: AC
  Administered 2021-07-28: 1000 mg via INTRAVENOUS

## 2021-07-27 MED ORDER — GABAPENTIN 300 MG PO CAPS: 300.0000 mg | ORAL_CAPSULE | Freq: Once | ORAL | Status: AC

## 2021-07-27 MED ORDER — CELECOXIB 200 MG PO CAPS: 400.0000 mg | ORAL_CAPSULE | Freq: Once | ORAL | Status: AC

## 2021-07-27 MED ORDER — ORAL CARE MOUTH RINSE: 15.0000 mL | Freq: Once | OROMUCOSAL | Status: AC

## 2021-07-27 MED ORDER — FAMOTIDINE 20 MG PO TABS
20.0000 mg | ORAL_TABLET | Freq: Once | ORAL | Status: AC
Start: 2021-07-27 — End: 2021-07-28

## 2021-07-27 MED ORDER — CEFAZOLIN SODIUM-DEXTROSE 2-4 GM/100ML-% IV SOLN
2.0000 g | INTRAVENOUS | Status: AC
  Administered 2021-07-28: 2 g via INTRAVENOUS

## 2021-07-27 MED ORDER — DEXAMETHASONE SODIUM PHOSPHATE 10 MG/ML IJ SOLN: 8.0000 mg | Freq: Once | INTRAMUSCULAR | Status: DC

## 2021-07-27 MED ORDER — CHLORHEXIDINE GLUCONATE 0.12 % MT SOLN: 15.0000 mL | Freq: Once | OROMUCOSAL | Status: AC

## 2021-07-27 MED ORDER — SODIUM CHLORIDE 0.9 % IV SOLN: INTRAVENOUS | Status: DC

## 2021-07-27 MED ORDER — CHLORHEXIDINE GLUCONATE 4 % EX LIQD
60.0000 mL | Freq: Once | CUTANEOUS | Status: AC
  Administered 2021-07-28: 4 via TOPICAL

## 2021-07-28 ENCOUNTER — Other Ambulatory Visit: Payer: Self-pay

## 2021-07-28 ENCOUNTER — Observation Stay
Admission: RE | Admit: 2021-07-28 | Discharge: 2021-07-29 | Disposition: A | Discharge status: Home Health | Payer: Medicare HMO | Attending: Orthopedic Surgery | Admitting: Orthopedic Surgery

## 2021-07-28 ENCOUNTER — Ambulatory Visit: Payer: Medicare HMO | Admitting: Urgent Care

## 2021-07-28 ENCOUNTER — Encounter: Payer: Self-pay | Admitting: Orthopedic Surgery

## 2021-07-28 ENCOUNTER — Encounter
Admission: RE | Disposition: A | Discharge status: Home Health | Payer: Self-pay | Source: Home / Self Care | Attending: Orthopedic Surgery

## 2021-07-28 ENCOUNTER — Observation Stay: Payer: Medicare HMO

## 2021-07-28 DIAGNOSIS — E1169 Type 2 diabetes mellitus with other specified complication: Secondary | ICD-10-CM | POA: Insufficient documentation

## 2021-07-28 DIAGNOSIS — Z79899 Other long term (current) drug therapy: Secondary | ICD-10-CM | POA: Insufficient documentation

## 2021-07-28 DIAGNOSIS — Z96659 Presence of unspecified artificial knee joint: Secondary | ICD-10-CM

## 2021-07-28 DIAGNOSIS — E039 Hypothyroidism, unspecified: Secondary | ICD-10-CM | POA: Diagnosis not present

## 2021-07-28 DIAGNOSIS — Z7984 Long term (current) use of oral hypoglycemic drugs: Secondary | ICD-10-CM | POA: Diagnosis not present

## 2021-07-28 DIAGNOSIS — Z7982 Long term (current) use of aspirin: Secondary | ICD-10-CM | POA: Insufficient documentation

## 2021-07-28 DIAGNOSIS — E119 Type 2 diabetes mellitus without complications: Secondary | ICD-10-CM

## 2021-07-28 DIAGNOSIS — E785 Hyperlipidemia, unspecified: Secondary | ICD-10-CM | POA: Insufficient documentation

## 2021-07-28 DIAGNOSIS — M1712 Unilateral primary osteoarthritis, left knee: Principal | ICD-10-CM | POA: Insufficient documentation

## 2021-07-28 HISTORY — PX: KNEE ARTHROPLASTY: SHX992

## 2021-07-28 LAB — GLUCOSE, CAPILLARY
Glucose-Capillary: 94 mg/dL (ref 70–99)
Glucose-Capillary: 131 mg/dL — ABNORMAL HIGH (ref 70–99)
Glucose-Capillary: 154 mg/dL — ABNORMAL HIGH (ref 70–99)

## 2021-07-28 LAB — ABO/RH: ABO/RH(D): O POS

## 2021-07-28 SURGERY — ARTHROPLASTY, KNEE, TOTAL, USING IMAGELESS COMPUTER-ASSISTED NAVIGATION
Anesthesia: Spinal | Site: Knee | Laterality: Left

## 2021-07-28 MED ORDER — DEXAMETHASONE SODIUM PHOSPHATE 10 MG/ML IJ SOLN
INTRAMUSCULAR | Status: AC
  Filled 2021-07-28: qty 1

## 2021-07-28 MED ORDER — MIDAZOLAM HCL 2 MG/2ML IJ SOLN
INTRAMUSCULAR | Status: AC
  Filled 2021-07-28: qty 2

## 2021-07-28 MED ORDER — ONDANSETRON HCL 4 MG/2ML IJ SOLN: 4.0000 mg | Freq: Four times a day (QID) | INTRAMUSCULAR | Status: DC | PRN

## 2021-07-28 MED ORDER — FERROUS SULFATE 325 (65 FE) MG PO TABS
325.0000 mg | ORAL_TABLET | Freq: Two times a day (BID) | ORAL | Status: DC
  Administered 2021-07-28 – 2021-07-29 (×2): 325 mg via ORAL
  Filled 2021-07-28 (×2): qty 1

## 2021-07-28 MED ORDER — PROPOFOL 10 MG/ML IV BOLUS
INTRAVENOUS | Status: DC | PRN
  Administered 2021-07-28: 20 mg via INTRAVENOUS

## 2021-07-28 MED ORDER — GLIMEPIRIDE 4 MG PO TABS
4.0000 mg | ORAL_TABLET | Freq: Every day | ORAL | Status: DC
  Administered 2021-07-29: 4 mg via ORAL
  Filled 2021-07-28: qty 1

## 2021-07-28 MED ORDER — ACETAMINOPHEN 10 MG/ML IV SOLN
INTRAVENOUS | Status: AC
  Filled 2021-07-28: qty 100

## 2021-07-28 MED ORDER — PHENYLEPHRINE HCL-NACL 20-0.9 MG/250ML-% IV SOLN
INTRAVENOUS | Status: DC | PRN
  Administered 2021-07-28: 50 ug/min via INTRAVENOUS

## 2021-07-28 MED ORDER — CELECOXIB 200 MG PO CAPS
200.0000 mg | ORAL_CAPSULE | Freq: Two times a day (BID) | ORAL | Status: DC
  Administered 2021-07-28 – 2021-07-29 (×2): 200 mg via ORAL
  Filled 2021-07-28 (×2): qty 1

## 2021-07-28 MED ORDER — TRANEXAMIC ACID 1000 MG/10ML IV SOLN
INTRAVENOUS | Status: AC
  Filled 2021-07-28: qty 10

## 2021-07-28 MED ORDER — ONDANSETRON HCL 4 MG/2ML IJ SOLN: 4.0000 mg | Freq: Once | INTRAMUSCULAR | Status: DC | PRN

## 2021-07-28 MED ORDER — PHENOL 1.4 % MT LIQD: 1.0000 | OROMUCOSAL | Status: DC | PRN

## 2021-07-28 MED ORDER — PROPOFOL 10 MG/ML IV BOLUS
INTRAVENOUS | Status: AC
  Filled 2021-07-28: qty 20

## 2021-07-28 MED ORDER — ACETAMINOPHEN 325 MG PO TABS: 325.0000 mg | ORAL_TABLET | Freq: Four times a day (QID) | ORAL | Status: DC | PRN

## 2021-07-28 MED ORDER — ENOXAPARIN SODIUM 30 MG/0.3ML IJ SOSY
30.0000 mg | PREFILLED_SYRINGE | Freq: Two times a day (BID) | INTRAMUSCULAR | Status: DC
  Administered 2021-07-29: 30 mg via SUBCUTANEOUS
  Filled 2021-07-28: qty 0.3

## 2021-07-28 MED ORDER — FLEET ENEMA 7-19 GM/118ML RE ENEM: 1.0000 | ENEMA | Freq: Once | RECTAL | Status: DC | PRN

## 2021-07-28 MED ORDER — CITALOPRAM HYDROBROMIDE 10 MG PO TABS
20.0000 mg | ORAL_TABLET | Freq: Every day | ORAL | Status: DC
Start: 2021-07-29 — End: 2021-07-29
  Administered 2021-07-29: 20 mg via ORAL
  Filled 2021-07-28: qty 2

## 2021-07-28 MED ORDER — SEMAGLUTIDE (2 MG/DOSE) 8 MG/3ML ~~LOC~~ SOPN: 2.0000 mg | PEN_INJECTOR | SUBCUTANEOUS | Status: DC

## 2021-07-28 MED ORDER — LEVOTHYROXINE SODIUM 50 MCG PO TABS
100.0000 ug | ORAL_TABLET | Freq: Every day | ORAL | Status: DC
  Administered 2021-07-29: 100 ug via ORAL
  Filled 2021-07-28: qty 2

## 2021-07-28 MED ORDER — ACETAMINOPHEN 10 MG/ML IV SOLN
INTRAVENOUS | Status: DC | PRN
  Administered 2021-07-28: 1000 mg via INTRAVENOUS

## 2021-07-28 MED ORDER — MAGNESIUM HYDROXIDE 400 MG/5ML PO SUSP
30.0000 mL | Freq: Every day | ORAL | Status: DC
  Administered 2021-07-28 – 2021-07-29 (×2): 30 mL via ORAL
  Filled 2021-07-28 (×2): qty 30

## 2021-07-28 MED ORDER — PROPOFOL 500 MG/50ML IV EMUL
INTRAVENOUS | Status: DC | PRN
  Administered 2021-07-28: 100 ug/kg/min via INTRAVENOUS

## 2021-07-28 MED ORDER — 0.9 % SODIUM CHLORIDE (POUR BTL) OPTIME
TOPICAL | Status: DC | PRN
  Administered 2021-07-28: 500 mL

## 2021-07-28 MED ORDER — BUPIVACAINE HCL (PF) 0.5 % IJ SOLN
INTRAMUSCULAR | Status: AC
  Filled 2021-07-28: qty 10

## 2021-07-28 MED ORDER — TRAMADOL HCL 50 MG PO TABS: 50.0000 mg | ORAL_TABLET | ORAL | Status: DC | PRN

## 2021-07-28 MED ORDER — PROPOFOL 1000 MG/100ML IV EMUL
INTRAVENOUS | Status: AC
Start: 2021-07-28 — End: ?
  Filled 2021-07-28: qty 100

## 2021-07-28 MED ORDER — CHLORHEXIDINE GLUCONATE 0.12 % MT SOLN
OROMUCOSAL | Status: AC
  Administered 2021-07-28: 15 mL via OROMUCOSAL
  Filled 2021-07-28: qty 15

## 2021-07-28 MED ORDER — BISACODYL 10 MG RE SUPP
10.0000 mg | Freq: Every day | RECTAL | Status: DC | PRN
Start: 2021-07-28 — End: 2021-07-29

## 2021-07-28 MED ORDER — OXYCODONE HCL 5 MG PO TABS
5.0000 mg | ORAL_TABLET | ORAL | Status: DC | PRN
  Administered 2021-07-29: 5 mg via ORAL
  Filled 2021-07-28: qty 1

## 2021-07-28 MED ORDER — CEFAZOLIN SODIUM-DEXTROSE 2-4 GM/100ML-% IV SOLN
INTRAVENOUS | Status: AC
  Filled 2021-07-28: qty 100

## 2021-07-28 MED ORDER — INSULIN ASPART 100 UNIT/ML IJ SOLN
0.0000 [IU] | Freq: Three times a day (TID) | INTRAMUSCULAR | Status: DC
  Administered 2021-07-29: 2 [IU] via SUBCUTANEOUS
  Filled 2021-07-28 (×2): qty 1

## 2021-07-28 MED ORDER — GABAPENTIN 300 MG PO CAPS
300.0000 mg | ORAL_CAPSULE | Freq: Every day | ORAL | Status: DC
  Administered 2021-07-28: 300 mg via ORAL
  Filled 2021-07-28: qty 1

## 2021-07-28 MED ORDER — ONDANSETRON HCL 4 MG/2ML IJ SOLN
INTRAMUSCULAR | Status: AC
  Filled 2021-07-28: qty 2

## 2021-07-28 MED ORDER — PHENYLEPHRINE HCL-NACL 20-0.9 MG/250ML-% IV SOLN
INTRAVENOUS | Status: AC
  Filled 2021-07-28: qty 250

## 2021-07-28 MED ORDER — MIDAZOLAM HCL 5 MG/5ML IJ SOLN
INTRAMUSCULAR | Status: DC | PRN
Start: 2021-07-28 — End: 2021-07-28
  Administered 2021-07-28: 1 mg via INTRAVENOUS

## 2021-07-28 MED ORDER — SODIUM CHLORIDE (PF) 0.9 % IJ SOLN
INTRAMUSCULAR | Status: DC | PRN
  Administered 2021-07-28: 120 mL via INTRAMUSCULAR

## 2021-07-28 MED ORDER — SODIUM CHLORIDE 0.9 % IR SOLN
Status: DC | PRN
  Administered 2021-07-28: 3000 mL

## 2021-07-28 MED ORDER — SENNOSIDES-DOCUSATE SODIUM 8.6-50 MG PO TABS
1.0000 | ORAL_TABLET | Freq: Two times a day (BID) | ORAL | Status: DC
  Administered 2021-07-28 – 2021-07-29 (×2): 1 via ORAL
  Filled 2021-07-28 (×2): qty 1

## 2021-07-28 MED ORDER — DIPHENHYDRAMINE HCL 12.5 MG/5ML PO ELIX: 12.5000 mg | ORAL_SOLUTION | ORAL | Status: DC | PRN

## 2021-07-28 MED ORDER — TRANEXAMIC ACID-NACL 1000-0.7 MG/100ML-% IV SOLN
INTRAVENOUS | Status: AC
  Administered 2021-07-28: 1000 mg via INTRAVENOUS
  Filled 2021-07-28: qty 100

## 2021-07-28 MED ORDER — CEFAZOLIN SODIUM-DEXTROSE 2-4 GM/100ML-% IV SOLN
2.0000 g | Freq: Four times a day (QID) | INTRAVENOUS | Status: AC
  Administered 2021-07-28 – 2021-07-29 (×2): 2 g via INTRAVENOUS
  Filled 2021-07-28 (×2): qty 100

## 2021-07-28 MED ORDER — TRANEXAMIC ACID-NACL 1000-0.7 MG/100ML-% IV SOLN: 1000.0000 mg | Freq: Once | INTRAVENOUS | Status: AC

## 2021-07-28 MED ORDER — GLYCOPYRROLATE 0.2 MG/ML IJ SOLN
INTRAMUSCULAR | Status: DC | PRN
  Administered 2021-07-28: .2 mg via INTRAVENOUS

## 2021-07-28 MED ORDER — BUPIVACAINE HCL (PF) 0.5 % IJ SOLN
INTRAMUSCULAR | Status: DC | PRN
  Administered 2021-07-28: 3 mL

## 2021-07-28 MED ORDER — CALCIUM CARBONATE 1250 (500 CA) MG PO TABS
500.0000 mg | ORAL_TABLET | Freq: Every day | ORAL | Status: DC
  Administered 2021-07-29: 500 mg via ORAL
  Filled 2021-07-28: qty 1

## 2021-07-28 MED ORDER — HYDROMORPHONE HCL 1 MG/ML IJ SOLN
0.5000 mg | INTRAMUSCULAR | Status: DC | PRN
  Administered 2021-07-29: 1 mg via INTRAVENOUS
  Administered 2021-07-29: 0.5 mg via INTRAVENOUS
  Filled 2021-07-28 (×2): qty 1

## 2021-07-28 MED ORDER — ONDANSETRON HCL 4 MG PO TABS: 4.0000 mg | ORAL_TABLET | Freq: Four times a day (QID) | ORAL | Status: DC | PRN

## 2021-07-28 MED ORDER — FENTANYL CITRATE (PF) 100 MCG/2ML IJ SOLN
INTRAMUSCULAR | Status: AC
Start: 2021-07-28 — End: ?
  Filled 2021-07-28: qty 2

## 2021-07-28 MED ORDER — ACETAMINOPHEN 10 MG/ML IV SOLN
1000.0000 mg | Freq: Four times a day (QID) | INTRAVENOUS | Status: DC
  Administered 2021-07-29 (×2): 1000 mg via INTRAVENOUS
  Filled 2021-07-28 (×3): qty 100

## 2021-07-28 MED ORDER — INSULIN ASPART 100 UNIT/ML IJ SOLN: 0.0000 [IU] | Freq: Every day | INTRAMUSCULAR | Status: DC

## 2021-07-28 MED ORDER — CELECOXIB 200 MG PO CAPS
ORAL_CAPSULE | ORAL | Status: AC
  Administered 2021-07-28: 400 mg via ORAL
  Filled 2021-07-28: qty 2

## 2021-07-28 MED ORDER — GABAPENTIN 300 MG PO CAPS
ORAL_CAPSULE | ORAL | Status: AC
  Administered 2021-07-28: 300 mg via ORAL
  Filled 2021-07-28: qty 1

## 2021-07-28 MED ORDER — SURGIPHOR WOUND IRRIGATION SYSTEM - OPTIME
TOPICAL | Status: DC | PRN
  Administered 2021-07-28: 1

## 2021-07-28 MED ORDER — MENTHOL 3 MG MT LOZG: 1.0000 | LOZENGE | OROMUCOSAL | Status: DC | PRN

## 2021-07-28 MED ORDER — FAMOTIDINE 20 MG PO TABS
ORAL_TABLET | ORAL | Status: AC
  Administered 2021-07-28: 20 mg via ORAL
  Filled 2021-07-28: qty 1

## 2021-07-28 MED ORDER — PROPOFOL 1000 MG/100ML IV EMUL
INTRAVENOUS | Status: AC
  Filled 2021-07-28: qty 100

## 2021-07-28 MED ORDER — SODIUM CHLORIDE 0.9 % IV SOLN: INTRAVENOUS | Status: DC

## 2021-07-28 MED ORDER — ALUM & MAG HYDROXIDE-SIMETH 200-200-20 MG/5ML PO SUSP: 30.0000 mL | ORAL | Status: DC | PRN

## 2021-07-28 MED ORDER — METOCLOPRAMIDE HCL 10 MG PO TABS
10.0000 mg | ORAL_TABLET | Freq: Three times a day (TID) | ORAL | Status: DC
  Administered 2021-07-28 – 2021-07-29 (×2): 10 mg via ORAL
  Filled 2021-07-28 (×2): qty 1

## 2021-07-28 MED ORDER — PRAVASTATIN SODIUM 20 MG PO TABS
80.0000 mg | ORAL_TABLET | Freq: Every day | ORAL | Status: DC
  Administered 2021-07-29: 80 mg via ORAL
  Filled 2021-07-28: qty 4

## 2021-07-28 MED ORDER — PANTOPRAZOLE SODIUM 40 MG PO TBEC
40.0000 mg | DELAYED_RELEASE_TABLET | Freq: Two times a day (BID) | ORAL | Status: DC
  Administered 2021-07-28 – 2021-07-29 (×2): 40 mg via ORAL
  Filled 2021-07-28 (×2): qty 1

## 2021-07-28 MED ORDER — FENTANYL CITRATE (PF) 100 MCG/2ML IJ SOLN: 25.0000 ug | INTRAMUSCULAR | Status: DC | PRN

## 2021-07-28 MED ORDER — OXYCODONE HCL 5 MG PO TABS
10.0000 mg | ORAL_TABLET | ORAL | Status: DC | PRN
  Administered 2021-07-28 – 2021-07-29 (×2): 10 mg via ORAL
  Filled 2021-07-28 (×2): qty 2

## 2021-07-28 SURGICAL SUPPLY — 78 items
ATTUNE PSFEM LTSZ6 NARCEM KNEE (Femur) ×1 IMPLANT
ATTUNE PSRP INSR SZ6 5 KNEE (Insert) ×1 IMPLANT
BASEPLATE TIBIAL ROTATING SZ 4 (Knees) ×1 IMPLANT
BATTERY INSTRU NAVIGATION (MISCELLANEOUS) ×8 IMPLANT
BLADE CLIPPER SURG (BLADE) ×1 IMPLANT
BLADE SAW 70X12.5 (BLADE) ×2 IMPLANT
BLADE SAW 90X13X1.19 OSCILLAT (BLADE) ×2 IMPLANT
BLADE SAW 90X25X1.19 OSCILLAT (BLADE) ×2 IMPLANT
BONE CEMENT GENTAMICIN (Cement) ×4 IMPLANT
CEMENT BONE GENTAMICIN 40 (Cement) IMPLANT
COOLER POLAR GLACIER W/PUMP (MISCELLANEOUS) ×2 IMPLANT
CUFF TOURN SGL QUICK 24 (TOURNIQUET CUFF)
CUFF TOURN SGL QUICK 34 (TOURNIQUET CUFF)
CUFF TRNQT CYL 24X4X16.5-23 (TOURNIQUET CUFF) IMPLANT
CUFF TRNQT CYL 34X4.125X (TOURNIQUET CUFF) IMPLANT
DRAPE 3/4 80X56 (DRAPES) ×2 IMPLANT
DRAPE INCISE IOBAN 66X45 STRL (DRAPES) ×1 IMPLANT
DRSG DERMACEA 8X12 NADH (GAUZE/BANDAGES/DRESSINGS) ×2 IMPLANT
DRSG MEPILEX SACRM 8.7X9.8 (GAUZE/BANDAGES/DRESSINGS) ×2 IMPLANT
DRSG OPSITE POSTOP 4X14 (GAUZE/BANDAGES/DRESSINGS) ×2 IMPLANT
DRSG TEGADERM 4X4.75 (GAUZE/BANDAGES/DRESSINGS) ×2 IMPLANT
DURAPREP 26ML APPLICATOR (WOUND CARE) ×4 IMPLANT
ELECT CAUTERY BLADE 6.4 (BLADE) ×2 IMPLANT
ELECT REM PT RETURN 9FT ADLT (ELECTROSURGICAL) ×2
ELECTRODE REM PT RTRN 9FT ADLT (ELECTROSURGICAL) ×1 IMPLANT
EX-PIN ORTHOLOCK NAV 4X150 (PIN) ×4 IMPLANT
GLOVE BIOGEL PI IND STRL 8 (GLOVE) ×1 IMPLANT
GLOVE BIOGEL PI INDICATOR 8 (GLOVE) ×1
GLOVE SURG ENC TEXT LTX SZ7.5 (GLOVE) ×8 IMPLANT
GLOVE SURG UNDER POLY LF SZ7.5 (GLOVE) ×2 IMPLANT
GOWN STRL REUS W/ TWL LRG LVL3 (GOWN DISPOSABLE) ×2 IMPLANT
GOWN STRL REUS W/ TWL XL LVL3 (GOWN DISPOSABLE) ×1 IMPLANT
GOWN STRL REUS W/TWL LRG LVL3 (GOWN DISPOSABLE) ×2
GOWN STRL REUS W/TWL XL LVL3 (GOWN DISPOSABLE) ×1
HEMOVAC 400CC 10FR (MISCELLANEOUS) ×2 IMPLANT
HOLDER FOLEY CATH W/STRAP (MISCELLANEOUS) ×2 IMPLANT
HOLSTER ELECTROSUGICAL PENCIL (MISCELLANEOUS) ×2 IMPLANT
HOOD PEEL AWAY FLYTE STAYCOOL (MISCELLANEOUS) ×4 IMPLANT
IV NS IRRIG 3000ML ARTHROMATIC (IV SOLUTION) ×2 IMPLANT
KIT TURNOVER KIT A (KITS) ×2 IMPLANT
KNIFE SCULPS 14X20 (INSTRUMENTS) ×2 IMPLANT
LABEL OR SOLS (LABEL) ×1 IMPLANT
MANIFOLD NEPTUNE II (INSTRUMENTS) ×4 IMPLANT
NDL SAFETY ECLIPSE 18X1.5 (NEEDLE) ×1 IMPLANT
NDL SPNL 20GX3.5 QUINCKE YW (NEEDLE) ×2 IMPLANT
NEEDLE HYPO 18GX1.5 SHARP (NEEDLE) ×1
NEEDLE SPNL 20GX3.5 QUINCKE YW (NEEDLE) ×4 IMPLANT
NS IRRIG 500ML POUR BTL (IV SOLUTION) ×2 IMPLANT
PACK TOTAL KNEE (MISCELLANEOUS) ×2 IMPLANT
PAD ABD DERMACEA PRESS 5X9 (GAUZE/BANDAGES/DRESSINGS) ×4 IMPLANT
PAD WRAPON POLAR KNEE (MISCELLANEOUS) ×1 IMPLANT
PATELLA MEDIAL ATTUN 35MM KNEE (Knees) ×1 IMPLANT
PENCIL SMOKE EVACUATOR COATED (MISCELLANEOUS) ×1 IMPLANT
PIN DRILL FIX HALF THREAD (BIT) ×4 IMPLANT
PIN FIXATION 1/8DIA X 3INL (PIN) ×2 IMPLANT
PULSAVAC PLUS IRRIG FAN TIP (DISPOSABLE) ×2
SOL PREP PVP 2OZ (MISCELLANEOUS) ×2
SOLUTION IRRIG SURGIPHOR (IV SOLUTION) ×1 IMPLANT
SOLUTION PREP PVP 2OZ (MISCELLANEOUS) ×1 IMPLANT
SOLUTION PRONTOSAN WOUND 350ML (IRRIGATION / IRRIGATOR) ×1 IMPLANT
SPONGE DRAIN TRACH 4X4 STRL 2S (GAUZE/BANDAGES/DRESSINGS) ×2 IMPLANT
STAPLER SKIN PROX 35W (STAPLE) ×2 IMPLANT
STOCKINETTE IMPERV 14X48 (MISCELLANEOUS) IMPLANT
STRAP TIBIA SHORT (MISCELLANEOUS) ×2 IMPLANT
SUCTION FRAZIER HANDLE 10FR (MISCELLANEOUS) ×1
SUCTION TUBE FRAZIER 10FR DISP (MISCELLANEOUS) ×1 IMPLANT
SUT VIC AB 0 CT1 36 (SUTURE) ×4 IMPLANT
SUT VIC AB 1 CT1 36 (SUTURE) ×4 IMPLANT
SUT VIC AB 2-0 CT2 27 (SUTURE) ×2 IMPLANT
SYR 20ML LL LF (SYRINGE) ×2 IMPLANT
SYR 30ML LL (SYRINGE) ×4 IMPLANT
TIP FAN IRRIG PULSAVAC PLUS (DISPOSABLE) ×1 IMPLANT
TOWEL OR 17X26 4PK STRL BLUE (TOWEL DISPOSABLE) ×1 IMPLANT
TOWER CARTRIDGE SMART MIX (DISPOSABLE) ×2 IMPLANT
TRAY FOLEY MTR SLVR 16FR STAT (SET/KITS/TRAYS/PACK) ×2 IMPLANT
WATER STERILE IRR 1000ML POUR (IV SOLUTION) ×1 IMPLANT
WATER STERILE IRR 500ML POUR (IV SOLUTION) ×1 IMPLANT
WRAPON POLAR PAD KNEE (MISCELLANEOUS) ×2

## 2021-07-28 NOTE — Op Note (Signed)
OPERATIVE NOTE ? ?DATE OF SURGERY:  07/28/2021 ? ?PATIENT NAME:  Wanda Ryan   ?DOB: 1950-07-13  ?MRN: [id number] ? ?PRE-OPERATIVE DIAGNOSIS: Degenerative arthrosis of the left knee, primary ? ?POST-OPERATIVE DIAGNOSIS:  Same ? ?PROCEDURE:  Left total knee arthroplasty using computer-assisted navigation ? ?SURGEON:  Jena Gauss. M.D. ? ?ASSISTANT: Baldwin Jamaica, PA-C (present and scrubbed throughout the case, critical for assistance with exposure, retraction, instrumentation, and closure) ? ?ANESTHESIA: spinal ? ?ESTIMATED BLOOD LOSS: 50 mL ? ?FLUIDS REPLACED: 1000 mL of crystalloid ? ?TOURNIQUET TIME: 88 minutes ? ?DRAINS: 2 medium Hemovac drains ? ?SOFT TISSUE RELEASES: Anterior cruciate ligament, posterior cruciate ligament, deep medial collateral ligament, patellofemoral ligament ? ?IMPLANTS UTILIZED: DePuy Attune size 6N posterior stabilized femoral component (cemented), size 4 rotating platform tibial component (cemented), 35 mm medialized dome patella (cemented), and a 5 mm stabilized rotating platform polyethylene insert. ? ?INDICATIONS FOR SURGERY: Wanda Ryan is a 71 y.o. year old female with a long history of progressive knee pain. X-rays demonstrated severe degenerative changes in tricompartmental fashion. The patient had not seen any significant improvement despite conservative nonsurgical intervention. After discussion of the risks and benefits of surgical intervention, the patient expressed understanding of the risks benefits and agree with plans for total knee arthroplasty.  ? ?The risks, benefits, and alternatives were discussed at length including but not limited to the risks of infection, bleeding, nerve injury, stiffness, blood clots, the need for revision surgery, cardiopulmonary complications, among others, and they were willing to proceed. ? ?PROCEDURE IN DETAIL: The patient was brought into the operating room and, after adequate spinal anesthesia was achieved, a tourniquet was  placed on the patient's upper thigh. The patient's knee and leg were cleaned and prepped with alcohol and DuraPrep and draped in the usual sterile fashion. A "timeout" was performed as per usual protocol. The lower extremity was exsanguinated using an Esmarch, and the tourniquet was inflated to 300 mmHg. An anterior longitudinal incision was made followed by a standard mid vastus approach. The deep fibers of the medial collateral ligament were elevated in a subperiosteal fashion off of the medial flare of the tibia so as to maintain a continuous soft tissue sleeve. The patella was subluxed laterally and the patellofemoral ligament was incised. Inspection of the knee demonstrated severe degenerative changes with full-thickness loss of articular cartilage. Osteophytes were debrided using a rongeur. Anterior and posterior cruciate ligaments were excised. Two 4.0 mm Schanz pins were inserted in the femur and into the tibia for attachment of the array of trackers used for computer-assisted navigation. Hip center was identified using a circumduction technique. Distal landmarks were mapped using the computer. The distal femur and proximal tibia were mapped using the computer. The distal femoral cutting guide was positioned using computer-assisted navigation so as to achieve a 5? distal valgus cut. The femur was sized and it was felt that a size 6N femoral component was appropriate. A size 6 femoral cutting guide was positioned and the anterior cut was performed and verified using the computer. This was followed by completion of the posterior and chamfer cuts. Femoral cutting guide for the central box was then positioned in the center box cut was performed. ? ?Attention was then directed to the proximal tibia. Medial and lateral menisci were excised. The extramedullary tibial cutting guide was positioned using computer-assisted navigation so as to achieve a 0? varus-valgus alignment and 3? posterior slope. The cut was  performed and verified using the computer. The proximal tibia was  sized and it was felt that a size 4 tibial tray was appropriate. Tibial and femoral trials were inserted followed by insertion of a 5 mm polyethylene insert. This allowed for excellent mediolateral soft tissue balancing both in flexion and in full extension. Finally, the patella was cut and prepared so as to accommodate a 35 mm medialized dome patella. A patella trial was placed and the knee was placed through a range of motion with excellent patellar tracking appreciated. The femoral trial was removed after debridement of posterior osteophytes. The central post-hole for the tibial component was reamed followed by insertion of a keel punch. Tibial trials were then removed. Cut surfaces of bone were irrigated with copious amounts of normal saline using pulsatile lavage and then suctioned dry. Polymethylmethacrylate cement with gentamicin was prepared in the usual fashion using a vacuum mixer. Cement was applied to the cut surface of the proximal tibia as well as along the undersurface of a size 4 rotating platform tibial component. Tibial component was positioned and impacted into place. Excess cement was removed using Personal assistant. Cement was then applied to the cut surfaces of the femur as well as along the posterior flanges of the size 6N femoral component. The femoral component was positioned and impacted into place. Excess cement was removed using Personal assistant. A 5 mm polyethylene trial was inserted and the knee was brought into full extension with steady axial compression applied. Finally, cement was applied to the backside of a 35 mm medialized dome patella and the patellar component was positioned and patellar clamp applied. Excess cement was removed using Personal assistant. After adequate curing of the cement, the tourniquet was deflated after a total tourniquet time of 88 minutes. Hemostasis was achieved using electrocautery. The knee was  irrigated with copious amounts of normal saline using pulsatile lavage followed by 450 ml of Surgiphor and then suctioned dry. 20 mL of 1.3% Exparel and 60 mL of 0.25% Marcaine in 40 mL of normal saline was injected along the posterior capsule, medial and lateral gutters, and along the arthrotomy site. A 5 mm stabilized rotating platform polyethylene insert was inserted and the knee was placed through a range of motion with excellent mediolateral soft tissue balancing appreciated and excellent patellar tracking noted. 2 medium drains were placed in the wound bed and brought out through separate stab incisions. The medial parapatellar portion of the incision was reapproximated using interrupted sutures of #1 Vicryl. Subcutaneous tissue was approximated in layers using first #0 Vicryl followed #2-0 Vicryl. The skin was approximated with skin staples. A sterile dressing was applied. ? ?The patient tolerated the procedure well and was transported to the recovery room in stable condition.   ? ?Daliyah Sramek P. Angie Fava., M.D.  ?

## 2021-07-28 NOTE — H&P (Signed)
The patient has been re-examined, and the chart reviewed, and there have been no interval changes to the documented history and physical.    The risks, benefits, and alternatives have been discussed at length. The patient expressed understanding of the risks benefits and agreed with plans for surgical intervention.  Burgess Sheriff P. Louella Medaglia, Jr. M.D.    

## 2021-07-28 NOTE — Progress Notes (Signed)
PT Cancellation Note ? ?Patient Details ?Name: Wanda Ryan ?MRN: [id number] ?DOB: [birth year] ? ? ?Cancelled Treatment:    Reason Eval/Treat Not Completed: Patient not medically ready ?Pt on ortho floor, still numb and unable to move surgical leg at this time.  Will initiate PT tomorrow AM. ? ?Malachi Pro ?07/28/2021, 5:12 PM ?

## 2021-07-28 NOTE — Transfer of Care (Signed)
Immediate Anesthesia Transfer of Care Note ? ?Patient: Wanda Ryan ? ?Procedure(s) Performed: COMPUTER ASSISTED TOTAL KNEE ARTHROPLASTY (Left: Knee) ? ?Patient Location: PACU ? ?Anesthesia Type:Spinal ? ?Level of Consciousness: drowsy ? ?Airway & Oxygen Therapy: Patient Spontanous Breathing and Patient connected to face mask oxygen ? ?Post-op Assessment: Report given to RN and Post -op Vital signs reviewed and stable ? ?Post vital signs: Reviewed and stable ? ?Last Vitals:  ?Vitals Value Taken Time  ?BP 95/50 07/28/21 1522  ?Temp 36.1 ?C 07/28/21 1519  ?Pulse 88 07/28/21 1523  ?Resp 17 07/28/21 1525  ?SpO2 98 % 07/28/21 1523  ?Vitals shown include unvalidated device data. ? ?Last Pain:  ?Vitals:  ? 07/28/21 1033  ?TempSrc: Temporal  ?   ? ?  ? ?Complications: No notable events documented. ?

## 2021-07-28 NOTE — Anesthesia Preprocedure Evaluation (Signed)
Anesthesia Evaluation  ?Patient identified by MRN, date of birth, ID band ?Patient awake ? ? ? ?Reviewed: ?Allergy & Precautions, H&P , NPO status , Patient's Chart, lab work & pertinent test results, reviewed documented beta blocker date and time  ? ?Airway ?Mallampati: II ? ? ?Neck ROM: full ? ? ? Dental ? ?(+) Poor Dentition ?  ?Pulmonary ?sleep apnea and Continuous Positive Airway Pressure Ventilation ,  ?  ?Pulmonary exam normal ? ? ? ? ? ? ? Cardiovascular ?Exercise Tolerance: Poor ?negative cardio ROS ?Normal cardiovascular exam ?Rhythm:regular Rate:Normal ? ? ?  ?Neuro/Psych ?PSYCHIATRIC DISORDERS Depression negative neurological ROS ?   ? GI/Hepatic ?negative GI ROS, Neg liver ROS,   ?Endo/Other  ?diabetes, Well Controlled, Type 2, Oral Hypoglycemic AgentsHypothyroidism Morbid obesity ? Renal/GU ?negative Renal ROS  ?negative genitourinary ?  ?Musculoskeletal ? ? Abdominal ?  ?Peds ? Hematology ?negative hematology ROS ?(+)   ?Anesthesia Other Findings ?Past Medical History: ?No date: Arthritis ?    Comment:  back ?No date: Depression ?No date: Diabetes mellitus without complication (HCC) ?No date: History of kidney stones ?No date: Hyperlipidemia ?No date: Hypothyroidism ?No date: Rotator cuff syndrome ?No date: Sleep apnea ?    Comment:  on CPAP ?No date: Vitamin D deficiency ?Past Surgical History: ?No date: ABDOMINAL HYSTERECTOMY ?1996: BREAST EXCISIONAL BIOPSY; Right ?    Comment:  neg ?No date: CARPAL TUNNEL RELEASE ?04/01/2020: CATARACT EXTRACTION W/PHACO; Left ?    Comment:  Procedure: CATARACT EXTRACTION PHACO AND INTRAOCULAR  ?             LENS PLACEMENT (IOC) LEFT DIABETIC 8.67 01:11.5 12.1%;   ?             Surgeon: Lockie Mola, MD;  Location: [facility]  ?             [facility];  Service: Ophthalmology;  Laterality: Left; ?04/29/2020: CATARACT EXTRACTION W/PHACO; Right ?    Comment:  Procedure: CATARACT EXTRACTION PHACO AND INTRAOCULAR  ?              LENS PLACEMENT (IOC) RIGHT DIABETIC 5.23 00:51.1 10.2%;   ?             Surgeon: Lockie Mola, MD;  Location: [facility]  ?             [facility];  Service: Ophthalmology;  Laterality:  ?             Right;  Diabetic - oral med ?No date: CHOLECYSTECTOMY ?No date: COLONOSCOPY ?05/07/2018: COLONOSCOPY WITH PROPOFOL; N/A ?    Comment:  Procedure: COLONOSCOPY WITH PROPOFOL;  Surgeon: Mechele Collin, ?             Wilber Bihari, MD;  Location: [facility];  Service:  ?             Endoscopy;  Laterality: N/A; ?No date: ESOPHAGOGASTRODUODENOSCOPY ? ? Reproductive/Obstetrics ?negative OB ROS ? ?  ? ? ? ? ? ? ? ? ? ? ? ? ? ?  ?  ? ? ? ? ? ? ? ? ?Anesthesia Physical ?Anesthesia Plan ? ?ASA: 3 ? ?Anesthesia Plan: General  ? ?Post-op Pain Management:   ? ?Induction:  ? ?PONV Risk Score and Plan: 4 or greater ? ?Airway Management Planned:  ? ?Additional Equipment:  ? ?Intra-op Plan:  ? ?Post-operative Plan:  ? ?Informed Consent: I have reviewed the patients History and Physical, chart, labs and discussed the procedure including the risks, benefits and alternatives for the  proposed anesthesia with the patient or authorized representative who has indicated his/her understanding and acceptance.  ? ? ? ?Dental Advisory Given ? ?Plan Discussed with: CRNA ? ?Anesthesia Plan Comments:   ? ? ? ? ? ? ?Anesthesia Quick Evaluation ? ?

## 2021-07-28 NOTE — Anesthesia Procedure Notes (Signed)
Procedure Name: [place] ?Date/Time: 07/28/2021 12:00 PM ?Performed by: Tollie Eth, CRNA ?Pre-anesthesia Checklist: Patient identified, Emergency Drugs available, Suction available and Patient being monitored ?Patient Re-evaluated:Patient Re-evaluated prior to induction ?Oxygen Delivery Method: Simple face mask ?Induction Type: IV induction ?Placement Confirmation: positive ETCO2 ? ? ? ? ?

## 2021-07-28 NOTE — Anesthesia Procedure Notes (Signed)
Spinal ? ?Patient location during procedure: OR ?Start time: 07/28/2021 11:40 AM ?End time: 07/28/2021 11:47 AM ?Reason for block: surgical anesthesia ?Staffing ?Performed: resident/CRNA  ?Anesthesiologist: Molli Barrows, MD ?Resident/CRNA: Tollie Eth, CRNA ?Preanesthetic Checklist ?Completed: patient identified, IV checked, site marked, risks and benefits discussed, surgical consent, monitors and equipment checked, pre-op evaluation and timeout performed ?Spinal Block ?Patient position: sitting ?Prep: Betadine ?Patient monitoring: heart rate, continuous pulse ox, blood pressure and cardiac monitor ?Approach: midline ?Location: L4-5 ?Injection technique: single-shot ?Needle ?Needle type: Whitacre and Introducer  ?Needle gauge: 24 G ?Needle length: 9 cm ?Assessment ?Events: CSF return ?Additional Notes ?Negative paresthesia. Negative blood return. Positive free-flowing CSF. Expiration date of kit checked and confirmed. Patient tolerated procedure well, without complications. ? ? ? ? ? ?

## 2021-07-29 ENCOUNTER — Encounter: Payer: Self-pay | Admitting: Orthopedic Surgery

## 2021-07-29 DIAGNOSIS — M1712 Unilateral primary osteoarthritis, left knee: Secondary | ICD-10-CM | POA: Diagnosis not present

## 2021-07-29 LAB — GLUCOSE, CAPILLARY: Glucose-Capillary: 141 mg/dL — ABNORMAL HIGH (ref 70–99)

## 2021-07-29 MED ORDER — TRAMADOL HCL 50 MG PO TABS: 50.0000 mg | ORAL_TABLET | ORAL | 0 refills | Status: AC | PRN

## 2021-07-29 MED ORDER — CELECOXIB 200 MG PO CAPS: 200.0000 mg | ORAL_CAPSULE | Freq: Two times a day (BID) | ORAL | 0 refills | Status: AC

## 2021-07-29 MED ORDER — OXYCODONE HCL 5 MG PO TABS: 5.0000 mg | ORAL_TABLET | ORAL | 0 refills | Status: AC | PRN

## 2021-07-29 MED ORDER — ENOXAPARIN SODIUM 40 MG/0.4ML IJ SOSY: 40.0000 mg | PREFILLED_SYRINGE | INTRAMUSCULAR | 0 refills | Status: AC

## 2021-07-29 NOTE — Progress Notes (Signed)
Purwick removed.  °

## 2021-07-29 NOTE — Evaluation (Signed)
Occupational Therapy Evaluation ?Patient Details ?Name: Wanda DeedsWanda Sue Dotts ?MRN: [id number] ?DOB: May 03, 1950 ?Today's Date: 07/29/2021 ? ? ?History of Present Illness 71 y/o female s/p L TKA 07/28/21  ? ?Clinical Impression ?  ?Pt seen for OT evaluation this date, POD#1 from above surgery. Pt was independent in all ADL prior to surgery and is eager to return to PLOF with less pain and improved safety and independence. Pt currently requires PRN minimal assist for LB dressing and bathing while in seated position due to pain and limited AROM of L knee. Pt/spouse instructed in polar care mgt, falls prevention strategies, home/routines modifications, DME/AE for LB bathing and dressing tasks, ADL transfers, car transfers, and compression stocking mgt. Handout provided to support recall and carryover. Pt/spouse verbalized understanding. Pt would benefit from skilled OT services including additional instruction in dressing techniques with or without assistive devices for dressing and bathing skills to support recall and carryover prior to discharge and ultimately to maximize safety, independence, and minimize falls risk and caregiver burden. Do not currently anticipate any OT needs following this hospitalization.     ? ?Recommendations for follow up therapy are one component of a multi-disciplinary discharge planning process, led by the attending physician.  Recommendations may be updated based on patient status, additional functional criteria and insurance authorization.  ? ?Follow Up Recommendations ? No OT follow up  ?  ?Assistance Recommended at Discharge PRN  ?Patient can return home with the following A little help with bathing/dressing/bathroom;Assist for transportation;Assistance with cooking/housework;Help with stairs or ramp for entrance ? ?  ?Functional Status Assessment ? Patient has had a recent decline in their functional status and demonstrates the ability to make significant improvements in function in a  reasonable and predictable amount of time.  ?Equipment Recommendations ? BSC/3in1;Other (comment) (reacher)  ?  ?Recommendations for Other Services   ? ? ?  ?Precautions / Restrictions Precautions ?Precautions: Fall ?Restrictions ?Weight Bearing Restrictions: Yes ?LLE Weight Bearing: Weight bearing as tolerated  ? ?  ? ?Mobility Bed Mobility ?  ?  ?  ?  ?  ?  ?  ?General bed mobility comments: NT up in recliner ?  ? ?Transfers ?Overall transfer level: Needs assistance ?Equipment used: Rolling walker (2 wheels) ?Transfers: Sit to/from Stand ?Sit to Stand: Supervision ?  ?  ?  ?  ?  ?  ?  ? ?  ?Balance Overall balance assessment: Modified Independent ?  ?  ?  ?  ?  ?  ?  ?  ?  ?  ?  ?  ?  ?  ?  ?  ?  ?  ?   ? ?ADL either performed or assessed with clinical judgement  ? ?ADL Overall ADL's : Needs assistance/impaired ?  ?  ?  ?  ?  ?  ?  ?  ?  ?  ?  ?  ?  ?  ?  ?  ?  ?  ?  ?General ADL Comments: Pt requires PRN MIN A for LB ADL tasks due to decreased strength/ROM in L knee. MIN-MOD A for compression stocking and polar care mgt. Family able to provide needed level of assist.  ? ? ? ?Vision   ?   ?   ?Perception   ?  ?Praxis   ?  ? ?Pertinent Vitals/Pain Pain Assessment ?Pain Assessment: 0-10 ?Pain Score: 4  ?Pain Location: L knee ?Pain Descriptors / Indicators: Aching ?Pain Intervention(s): Limited activity within patient's tolerance, Monitored during session, Repositioned, Premedicated  before session, Ice applied  ? ? ? ?Hand Dominance   ?  ?Extremity/Trunk Assessment Upper Extremity Assessment ?Upper Extremity Assessment: Overall WFL for tasks assessed ?  ?Lower Extremity Assessment ?Lower Extremity Assessment: Overall WFL for tasks assessed;LLE deficits/detail ?LLE Deficits / Details: expected post-op strength deficits ?  ?  ?  ?Communication Communication ?Communication: No difficulties ?  ?Cognition Arousal/Alertness: Awake/alert ?Behavior During Therapy: [facility] for tasks assessed/performed ?Overall Cognitive Status:  Within Functional Limits for tasks assessed ?  ?  ?  ?  ?  ?  ?  ?  ?  ?  ?  ?  ?  ?  ?  ?  ?  ?  ?  ?General Comments    ? ?  ?Exercises Other Exercises ?Other Exercises: Pt/spouse instructed in polar care mgt, compression stocking mgt, falls prevention, home/routines modifications, AE/DME for ADL, and ADL transfer training including car transfers. Handout provided. ?  ?Shoulder Instructions    ? ? ?Home Living Family/patient expects to be discharged to:: Private residence ?Living Arrangements: Spouse/significant other ?Available Help at Discharge: Available 24 hours/day;Family ?Type of Home: House ?Home Access: Stairs to enter ?Entrance Stairs-Number of Steps: 2 ?Entrance Stairs-Rails: None ?Home Layout: One level ?  ?  ?Bathroom Shower/Tub: Walk-in shower ?  ?Bathroom Toilet: Standard ?  ?  ?Home Equipment: Shower seat - built in;Hand held shower head;Grab bars - tub/shower ?  ?  ?  ? ?  ?Prior Functioning/Environment Prior Level of Function : Independent/Modified Independent;Working/employed ?  ?  ?  ?  ?  ?  ?Mobility Comments: Pt typically able to be active and independent ?  ?  ? ?  ?  ?OT Problem List: Decreased strength;Decreased range of motion;Pain;Decreased knowledge of use of DME or AE ?  ?   ?OT Treatment/Interventions: Self-care/ADL training;Therapeutic exercise;Therapeutic activities;DME and/or AE instruction;Patient/family education  ?  ?OT Goals(Current goals can be found in the care plan section) Acute Rehab OT Goals ?Patient Stated Goal: go home ?OT Goal Formulation: With patient/family ?Time For Goal Achievement: 08/12/21 ?Potential to Achieve Goals: Good ?ADL Goals ?Pt Will Perform Lower Body Dressing: with modified independence;with adaptive equipment;sit to/from stand ?Additional ADL Goal #1: Pt will independently instruct family in polar care mgt ?Additional ADL Goal #2: Pt will independently instruct family in compression stocking mgt  ?OT Frequency:   ?  ? ?Co-evaluation   ?  ?  ?  ?  ? ?   ?AM-PAC OT "6 Clicks" Daily Activity     ?Outcome Measure Help from another person eating meals?: None ?Help from another person taking care of personal grooming?: None ?Help from another person toileting, which includes using toliet, bedpan, or urinal?: A Little ?Help from another person bathing (including washing, rinsing, drying)?: A Little ?Help from another person to put on and taking off regular upper body clothing?: None ?Help from another person to put on and taking off regular lower body clothing?: A Little ?6 Click Score: 21 ?  ?End of Session   ? ?Activity Tolerance: Patient tolerated treatment well ?Patient left: in chair;with call bell/phone within reach;with family/visitor present;Other (comment) (polar care in place, rolled towel under L ankle) ? ?OT Visit Diagnosis: Other abnormalities of gait and mobility (R26.89);Pain ?Pain - Right/Left: Left ?Pain - part of body: Knee  ?              ?Time: 4196-2229 ?OT Time Calculation (min): 39 min ?Charges:  OT General Charges ?$OT Visit: 1 Visit ?OT Evaluation ?$OT Eval  Moderate Complexity: 1 Mod ?OT Treatments ?$Self Care/Home Management : 23-37 mins ? ?Arman Filter., MPH, MS, OTR/L ?ascom [phone] ?07/29/21, 10:22 AM ? ?

## 2021-07-29 NOTE — Plan of Care (Signed)

## 2021-07-29 NOTE — Progress Notes (Signed)
Met with the patient in the room at the bedside ?The patient lives at Home with her husband ?The patient  currently has no DME ?The patient will need RW to be delivered by [employer] to the bedside ?They have transportation with her husband ?They can afford their medication ? ?They are set up with [place] for Home health services ? ? ?Admitted for: Knee replacement ?

## 2021-07-29 NOTE — Evaluation (Signed)
Physical Therapy Evaluation ?Patient Details ?Name: Wanda Ryan ?MRN: [id number] ?DOB: Aug 12, 1950 ?Today's Date: 07/29/2021 ? ?History of Present Illness ? 71 y/o female s/p L TKA 07/28/21  ?Clinical Impression ? Pt did very well with POD1 PT exam.  She showed great quad control and ROM, was able to circumambulate the nurses' station with confidence, safe and consistent cadence and was able to negotiate up/down 4 steps w/o assist and minimal cuing.  Ultimately doing very well and has reached/surpassed typically expected POD1 expectations.  ?   ? ?Recommendations for follow up therapy are one component of a multi-disciplinary discharge planning process, led by the attending physician.  Recommendations may be updated based on patient status, additional functional criteria and insurance authorization. ? ?Follow Up Recommendations Follow physician's recommendations for discharge plan and follow up therapies ? ?  ?Assistance Recommended at Discharge Set up Supervision/Assistance  ?Patient can return home with the following ?   ? ?  ?Equipment Recommendations Rolling walker (2 wheels);BSC/3in1  ?Recommendations for Other Services ?    ?  ?Functional Status Assessment Patient has had a recent decline in their functional status and demonstrates the ability to make significant improvements in function in a reasonable and predictable amount of time.  ? ?  ?Precautions / Restrictions Precautions ?Precautions: Fall ?Restrictions ?LLE Weight Bearing: Weight bearing as tolerated  ? ?  ? ?Mobility ? Bed Mobility ?Overal bed mobility: Modified Independent ?  ?  ?  ?  ?  ?  ?  ?  ? ?Transfers ?Overall transfer level: Needs assistance ?Equipment used: Rolling walker (2 wheels) ?Transfers: Sit to/from Stand ?Sit to Stand: Supervision ?  ?  ?  ?  ?  ?General transfer comment: cuing to insure appropriate UE use and sequencing, no physical assist ?  ? ?Ambulation/Gait ?Ambulation/Gait assistance: Supervision ?Gait Distance (Feet): 250  Feet ?Assistive device: Rolling walker (2 wheels) ?  ?  ?  ?  ?General Gait Details: Pt was able to quickly attain consistent cadence with no limp or hesitation.  Appropriate use of walker that was not excessive, vitals appropriate t/o ? ?Stairs ?Stairs: Yes ?Stairs assistance: Supervision ?Stair Management: No rails, Backwards, With walker ?Number of Stairs: 4 ?General stair comments: Pt able to negotiate up/down steps without direct assist and needing only minimal cuing. ? ?Wheelchair Mobility ?  ? ?Modified Rankin (Stroke Patients Only) ?  ? ?  ? ?Balance Overall balance assessment: Modified Independent ?  ?  ?  ?  ?  ?  ?  ?  ?  ?  ?  ?  ?  ?  ?  ?  ?  ?  ?   ? ? ? ?Pertinent Vitals/Pain Pain Assessment ?Pain Assessment: 0-10 ?Pain Score: 3  ?Pain Location: L knee  ? ? ?Home Living Family/patient expects to be discharged to:: Private residence ?Living Arrangements: Spouse/significant other ?Available Help at Discharge: Available 24 hours/day ?  ?Home Access: Stairs to enter ?Entrance Stairs-Rails: None ?Entrance Stairs-Number of Steps: 2 ?  ?Home Layout: One level ?  ?   ?  ?Prior Function Prior Level of Function : Independent/Modified Independent;Working/employed ?  ?  ?  ?  ?  ?  ?Mobility Comments: Pt typically able to be active and independent ?  ?  ? ? ?Hand Dominance  ?   ? ?  ?Extremity/Trunk Assessment  ? Upper Extremity Assessment ?Upper Extremity Assessment: Overall WFL for tasks assessed ?  ? ?Lower Extremity Assessment ?Lower Extremity Assessment: Overall WFL for  tasks assessed (expected post-op weakness) ?  ? ?   ?Communication  ? Communication: No difficulties  ?Cognition Arousal/Alertness: Awake/alert ?Behavior During Therapy: [facility] for tasks assessed/performed ?Overall Cognitive Status: Within Functional Limits for tasks assessed ?  ?  ?  ?  ?  ?  ?  ?  ?  ?  ?  ?  ?  ?  ?  ?  ?  ?  ?  ? ?  ?General Comments   ? ?  ?Exercises Total Joint Exercises ?Ankle Circles/Pumps: AROM, 10 reps ?Quad Sets:  Strengthening, 15 reps ?Heel Slides: AROM, 10 reps (with resisted leg ext) ?Hip ABduction/ADduction: Strengthening, 10 reps ?Straight Leg Raises: AROM, 10 reps ?Knee Flexion: PROM, 5 reps ?Goniometric ROM: 0-100 AROM (108 PROM)  ? ?Assessment/Plan  ?  ?PT Assessment Patient needs continued PT services  ?PT Problem List Decreased strength;Decreased range of motion;Decreased activity tolerance;Decreased balance;Decreased mobility;Decreased knowledge of use of DME;Decreased safety awareness;Pain ? ?   ?  ?PT Treatment Interventions DME instruction;Gait training;Stair training;Therapeutic activities;Functional mobility training;Therapeutic exercise;Balance training;Neuromuscular re-education;Patient/family education   ? ?PT Goals (Current goals can be found in the Care Plan section)  ?Acute Rehab PT Goals ?Patient Stated Goal: go home ?PT Goal Formulation: With patient ?Time For Goal Achievement: 08/12/21 ?Potential to Achieve Goals: Good ? ?  ?Frequency BID ?  ? ? ?Co-evaluation   ?  ?  ?  ?  ? ? ?  ?AM-PAC PT "6 Clicks" Mobility  ?Outcome Measure Help needed turning from your back to your side while in a flat bed without using bedrails?: None ?Help needed moving from lying on your back to sitting on the side of a flat bed without using bedrails?: None ?Help needed moving to and from a bed to a chair (including a wheelchair)?: None ?Help needed standing up from a chair using your arms (e.g., wheelchair or bedside chair)?: None ?Help needed to walk in hospital room?: None ?Help needed climbing 3-5 steps with a railing? : A Little ?6 Click Score: 23 ? ?  ?End of Session Equipment Utilized During Treatment: Gait belt ?Activity Tolerance: Patient tolerated treatment well ?Patient left: in chair;with call bell/phone within reach;with nursing/sitter in room ?Nurse Communication: Mobility status ?PT Visit Diagnosis: Muscle weakness (generalized) (M62.81);Difficulty in walking, not elsewhere classified (R26.2);Pain ?Pain -  Right/Left: Left ?Pain - part of body: Knee ?  ? ?Time: 7408-1448 ?PT Time Calculation (min) (ACUTE ONLY): 47 min ? ? ?Charges:   PT Evaluation ?$PT Eval Low Complexity: 1 Low ?PT Treatments ?$Gait Training: 8-22 mins ?$Therapeutic Exercise: 8-22 mins ?  ?   ? ? ?Malachi Pro, DPT ?07/29/2021, 9:55 AM ? ?

## 2021-07-29 NOTE — Anesthesia Postprocedure Evaluation (Signed)
Anesthesia Post Note ? ?Patient: Wanda Ryan ? ?Procedure(s) Performed: COMPUTER ASSISTED TOTAL KNEE ARTHROPLASTY (Left: Knee) ? ?Patient location during evaluation: Nursing Unit ?Anesthesia Type: Spinal ?Level of consciousness: awake ?Pain management: pain level controlled ?Respiratory status: spontaneous breathing ?Cardiovascular status: stable ?Anesthetic complications: no ? ? ?No notable events documented. ? ? ?Last Vitals:  ?Vitals:  ? 07/28/21 2108 07/29/21 0551  ?BP: (!) 164/90 (!) 113/50  ?Pulse: 83 (!) 54  ?Resp: 16 16  ?Temp: (!) 36.3 ?C (!) 36.4 ?C  ?SpO2: 100% 98%  ?  ?Last Pain:  ?Vitals:  ? 07/29/21 0556  ?TempSrc:   ?PainSc: 1   ? ? ?  ?  ?  ?  ?  ?  ? ?Lerry Liner ? ? ? ? ?

## 2021-07-29 NOTE — Discharge Summary (Signed)
?Physician Discharge Summary  ?Patient ID: ?Wanda Ryan ?MRN: [id number] ?DOB/AGE: 1950/07/22 71 y.o. ? ?Admit date: 07/28/2021 ?Discharge date: 07/29/2021 ? ?Admission Diagnoses:  ?Total knee replacement status [Z96.659] ? ?Surgeries:Procedure(s): ?Left total knee arthroplasty using computer-assisted navigation ?  ?SURGEON:  Marciano Sequin. M.D. ?  ?ASSISTANT: Cassell Smiles, PA-C (present and scrubbed throughout the case, critical for assistance with exposure, retraction, instrumentation, and closure) ?  ?ANESTHESIA: spinal ?  ?ESTIMATED BLOOD LOSS: 50 mL ?  ?FLUIDS REPLACED: 1000 mL of crystalloid ?  ?TOURNIQUET TIME: 88 minutes ?  ?DRAINS: 2 medium Hemovac drains ?  ?SOFT TISSUE RELEASES: Anterior cruciate ligament, posterior cruciate ligament, deep medial collateral ligament, patellofemoral ligament ?  ?IMPLANTS UTILIZED: DePuy Attune size 6N posterior stabilized femoral component (cemented), size 4 rotating platform tibial component (cemented), 35 mm medialized dome patella (cemented), and a 5 mm stabilized rotating platform polyethylene insert. ? ?Discharge Diagnoses: ?Patient Active Problem List  ? Diagnosis Date Noted  ? Total knee replacement status 07/28/2021  ? Acquired hypothyroidism 07/25/2021  ? Depression 07/25/2021  ? Morbid obesity due to excess calories ([place]) 07/25/2021  ? OSA on CPAP 07/25/2021  ? Primary osteoarthritis of both knees 05/23/2021  ? Restless legs syndrome 02/13/2017  ? Pure hypercholesterolemia 07/03/2015  ? Vitamin D deficiency 02/18/2014  ? Type 2 diabetes mellitus with hyperlipidemia ([place]) 02/17/2014  ? ? ?Past Medical History:  ?Diagnosis Date  ? Arthritis   ? back  ? Depression   ? Diabetes mellitus without complication ([place])   ? History of kidney stones   ? Hyperlipidemia   ? Hypothyroidism   ? Rotator cuff syndrome   ? Sleep apnea   ? on CPAP  ? Vitamin D deficiency   ? ?  ?Transfusion:  ?  ?Consultants (if any):  ? ?Discharged Condition: Improved ? ?Hospital Course: Wanda Ryan is an 71 y.o. female who was admitted 07/28/2021 with a diagnosis of left knee osteoarthritis and went to the operating room on 07/28/2021 and underwent left total knee arthoplasty. The patient received perioperative antibiotics for prophylaxis (see below). The patient tolerated the procedure well and was transported to PACU in stable condition. After meeting PACU criteria, the patient was subsequently transferred to the Orthopaedics/Rehabilitation unit.  ? ?The patient received DVT prophylaxis in the form of early mobilization, Lovenox, TED hose, and SCDs . A sacral pad had been placed and heels were elevated off of the bed with rolled towels in order to protect skin integrity. Foley catheter was discontinued on postoperative day #0. Wound drains were discontinued on postoperative day #1. The surgical incision was healing well without signs of infection. ? ?Physical therapy was initiated postoperatively for transfers, gait training, and strengthening. Occupational therapy was initiated for activities of daily living and evaluation for assisted devices. Rehabilitation goals were reviewed in detail with the patient. The patient made steady progress with physical therapy and physical therapy recommended discharge to Home.  ? ?The patient achieved the preliminary goals of this hospitalization and was felt to be medically and orthopaedically appropriate for discharge. ? ?She was given perioperative antibiotics:  ?Anti-infectives (From admission, onward)  ? ? Start     Dose/Rate Route Frequency Ordered Stop  ? 07/28/21 1800  ceFAZolin (ANCEF) IVPB 2g/100 mL premix       ? 2 g ?200 mL/hr over 30 Minutes Intravenous Every 6 hours 07/28/21 1704 07/29/21 0215  ? 07/28/21 1019  ceFAZolin (ANCEF) 2-4 GM/100ML-% IVPB       ?Note  to Pharmacy: Trudie Reed S: cabinet override  ?    07/28/21 1019 07/28/21 1213  ? 07/28/21 0600  ceFAZolin (ANCEF) IVPB 2g/100 mL premix       ? 2 g ?200 mL/hr over 30 Minutes Intravenous On  call to O.R. 07/27/21 2330 07/28/21 1151  ? ?  ?. ? ?Recent vital signs:  ?Vitals:  ? 07/29/21 0551 07/29/21 0759  ?BP: (!) 113/50 (!) 90/54  ?Pulse: (!) 54 61  ?Resp: 16   ?Temp: (!) 97.5 ?F (36.4 ?C) 97.9 ?F (36.6 ?C)  ?SpO2: 98% 96%  ? ? ?Recent laboratory studies:  ?No results for input(s): WBC, HGB, HCT, PLT, K, CL, CO2, BUN, CREATININE, GLUCOSE, CALCIUM, LABPT, INR in the last 72 hours. ? ?Diagnostic Studies: DG Knee Left Port ? ?Result Date: 07/28/2021 ?CLINICAL DATA:  Status post left knee replacement. EXAM: PORTABLE LEFT KNEE - 1-2 VIEW COMPARISON:  None. FINDINGS: Left femoral, patellar and tibial components are well situated. Surgical drain is seen anterior to the distal femur. IMPRESSION: Status post left total knee arthroplasty. Electronically Signed   By: Marijo Conception M.D.   On: 07/28/2021 15:36   ? ?Discharge Medications:   ?Allergies as of 07/29/2021   ? ?   Reactions  ? Metformin And Related Diarrhea  ? ?  ? ?  ?Medication List  ?  ? ?STOP taking these medications   ? ?aspirin EC 81 MG tablet ?  ? ?  ? ?TAKE these medications   ? ?b complex vitamins capsule ?Take 1 capsule by mouth daily. ?  ?calcium carbonate 1500 (600 Ca) MG Tabs tablet ?Commonly known as: OSCAL ?Take 600 mg of elemental calcium by mouth daily with breakfast. ?  ?Calcium Carbonate-Vitamin D 300-2.5 MG-MCG Caps ?Take by mouth. ?  ?celecoxib 200 MG capsule ?Commonly known as: CELEBREX ?Take 1 capsule (200 mg total) by mouth 2 (two) times daily. ?  ?citalopram 20 MG tablet ?Commonly known as: CELEXA ?Take 20 mg by mouth daily. ?  ?enoxaparin 40 MG/0.4ML injection ?Commonly known as: LOVENOX ?Inject 0.4 mLs (40 mg total) into the skin daily for 14 days. ?  ?gabapentin 300 MG capsule ?Commonly known as: NEURONTIN ?Take 300 mg by mouth at bedtime. ?  ?glimepiride 4 MG tablet ?Commonly known as: AMARYL ?Take 4 mg by mouth daily with breakfast. ?  ?levothyroxine 100 MCG tablet ?Commonly known as: SYNTHROID ?Take 100 mcg by mouth daily  before breakfast. ?  ?lisinopril 2.5 MG tablet ?Commonly known as: ZESTRIL ?Take 2.5 mg by mouth daily. ?  ?oxyCODONE 5 MG immediate release tablet ?Commonly known as: Oxy IR/ROXICODONE ?Take 1 tablet (5 mg total) by mouth every 4 (four) hours as needed for severe pain. ?  ?pravastatin 80 MG tablet ?Commonly known as: PRAVACHOL ?Take 80 mg by mouth daily. ?  ?Semaglutide (2 MG/DOSE) 8 MG/3ML Sopn ?Inject into the skin. ?  ?traMADol 50 MG tablet ?Commonly known as: ULTRAM ?Take 1 tablet (50 mg total) by mouth every 4 (four) hours as needed for moderate pain. ?  ? ?  ? ?  ?  ? ? ?  ?Durable Medical Equipment  ?(From admission, onward)  ?  ? ? ?  ? ?  Start     Ordered  ? 07/28/21 1705  DME Walker rolling  Once       ?Question:  Patient needs a walker to treat with the following condition  Answer:  Total knee replacement status  ? 07/28/21 1704  ? 07/28/21 1705  DME Bedside commode  Once       ?Question:  Patient needs a bedside commode to treat with the following condition  Answer:  Total knee replacement status  ? 07/28/21 1704  ? ?  ?  ? ?  ? ? ?Disposition: Home with home health PT ? ? ? ? Follow-up Information   ? ? Tamala Julian B, PA-C Follow up on 08/12/2021.   ?Specialty: Orthopedic Surgery ?Why: at 1:15pm ?Contact information: ?[address] ?[facility] ?[place] [address] ?[phone] ? ? ?  ?  ? ? Dereck Leep, MD Follow up on 09/09/2021.   ?Specialty: Orthopedic Surgery ?Why: at 1:30pm ?Contact information: ?[address] ?[place] [address] ?[phone] ? ? ?  ?  ? ?  ?  ? ?  ? ? ? ?Tamala Julian, PA-C ?07/29/2021, 9:38 AM ? ? ? ? ? ?

## 2021-07-29 NOTE — Progress Notes (Signed)
?  Subjective: ?1 Day Post-Op Procedure(s) (LRB): ?COMPUTER ASSISTED TOTAL KNEE ARTHROPLASTY (Left) ?Patient reports pain as well-controlled.   ?Patient is well, and has had no acute complaints or problems ?Plan is to go Home after hospital stay. ?Negative for chest pain and shortness of breath ?Fever: no ?Gastrointestinal: negative for nausea and vomiting.  Patient has not had a bowel movement. ? ?Objective: ?Vital signs in last 24 hours: ?Temp:  [97 ?F (36.1 ?C)-97.5 ?F (36.4 ?C)] 97.5 ?F (36.4 ?C) (04/13 0551) ?Pulse Rate:  [54-85] 61 (04/13 0759) ?Resp:  [11-18] 16 (04/13 0551) ?BP: (88-164)/(50-90) 90/54 (04/13 0759) ?SpO2:  [95 %-100 %] 96 % (04/13 0759) ? ?Intake/Output from previous day: ? ?Intake/Output Summary (Last 24 hours) at 07/29/2021 0813 ?Last data filed at 07/29/2021 0557 ?Gross per 24 hour  ?Intake 2575.61 ml  ?Output 1240 ml  ?Net 1335.61 ml  ?  ?Intake/Output this shift: ?No intake/output data recorded. ? ?Labs: ?No results for input(s): HGB in the last 72 hours. ?No results for input(s): WBC, RBC, HCT, PLT in the last 72 hours. ?No results for input(s): NA, K, CL, CO2, BUN, CREATININE, GLUCOSE, CALCIUM in the last 72 hours. ?No results for input(s): LABPT, INR in the last 72 hours. ? ? ?EXAM ?General - Patient is Alert, Appropriate, and Oriented ?Extremity - Neurovascular intact ?Dorsiflexion/Plantar flexion intact ?Compartment soft ?Dressing/Incision -Postoperative dressing remains in place., Polar Care in place and working. , Hemovac in place. After removal of post-op dressing, moderate bloody drainage on bandage.  ?Motor Function - intact, moving foot and toes well on exam. Able to perform independent SLR.  ?Cardiovascular- Regular rate and rhythm, no murmurs/rubs/gallops ?Respiratory- Lungs clear to auscultation bilaterally ?Gastrointestinal- soft, nontender, and active bowel sounds ? ? ?Assessment/Plan: ?1 Day Post-Op Procedure(s) (LRB): ?COMPUTER ASSISTED TOTAL KNEE ARTHROPLASTY  (Left) ?Principal Problem: ?  Total knee replacement status ? ?Estimated body mass index is 41.38 kg/m? as calculated from the following: ?  Height as of 07/15/21: 5\' 3"  (1.6 m). ?  Weight as of 07/15/21: 106 kg. ?Advance diet ?Up with therapy ? ? ? ?Post-op dressing removed. , Hemovac removed., Mini compression dressing applied. , and Fresh honeycomb dressing applied.  ? ?DVT Prophylaxis - Lovenox, Ted hose, and SCDs ?Weight-Bearing as tolerated to left leg ? ?07/17/21, PA-C ?[facility] ?07/29/2021, 8:13 AM ? ?

## 2021-08-23 ENCOUNTER — Encounter: Payer: Self-pay | Admitting: Orthopedic Surgery

## 2021-09-20 ENCOUNTER — Ambulatory Visit
Admission: RE | Admit: 2021-09-20 | Discharge: 2021-09-20 | Disposition: A | Discharge status: Home / Self Care | Payer: Medicare HMO | Source: Ambulatory Visit | Attending: Family Medicine | Admitting: Family Medicine

## 2021-09-20 DIAGNOSIS — Z1231 Encounter for screening mammogram for malignant neoplasm of breast: Secondary | ICD-10-CM | POA: Diagnosis not present

## 2021-09-23 ENCOUNTER — Other Ambulatory Visit: Payer: Self-pay | Admitting: Family Medicine

## 2021-09-23 DIAGNOSIS — N63 Unspecified lump in unspecified breast: Secondary | ICD-10-CM

## 2021-09-23 DIAGNOSIS — R928 Other abnormal and inconclusive findings on diagnostic imaging of breast: Secondary | ICD-10-CM

## 2021-10-04 ENCOUNTER — Ambulatory Visit
Admission: RE | Admit: 2021-10-04 | Discharge: 2021-10-04 | Disposition: A | Discharge status: Home / Self Care | Payer: Medicare HMO | Source: Ambulatory Visit | Attending: Family Medicine | Admitting: Family Medicine

## 2021-10-04 DIAGNOSIS — N63 Unspecified lump in unspecified breast: Secondary | ICD-10-CM | POA: Insufficient documentation

## 2021-10-04 DIAGNOSIS — R928 Other abnormal and inconclusive findings on diagnostic imaging of breast: Secondary | ICD-10-CM | POA: Diagnosis present

## 2023-04-21 ENCOUNTER — Other Ambulatory Visit: Payer: Self-pay | Admitting: Family Medicine

## 2023-04-21 DIAGNOSIS — Z1231 Encounter for screening mammogram for malignant neoplasm of breast: Secondary | ICD-10-CM

## 2023-05-02 ENCOUNTER — Ambulatory Visit
Admission: RE | Admit: 2023-05-02 | Discharge: 2023-05-02 | Disposition: A | Discharge status: Home / Self Care | Payer: Medicare Other | Source: Ambulatory Visit | Attending: Family Medicine | Admitting: Family Medicine

## 2023-05-02 DIAGNOSIS — Z1231 Encounter for screening mammogram for malignant neoplasm of breast: Secondary | ICD-10-CM | POA: Insufficient documentation

## 2024-04-08 IMAGING — US US BREAST*L* LIMITED INC AXILLA
1 series · 7 of 7 positions shown · non-contrast
Comparison: None Available.

CLINICAL DATA: Recall from screening to evaluate a small left
breast mass.

EXAM:
ULTRASOUND OF THE LEFT BREAST

[Series 1: us brst ltd uni left inc trans 20 · 7 acquisitions, 7 frames shown]
[im 1/7]
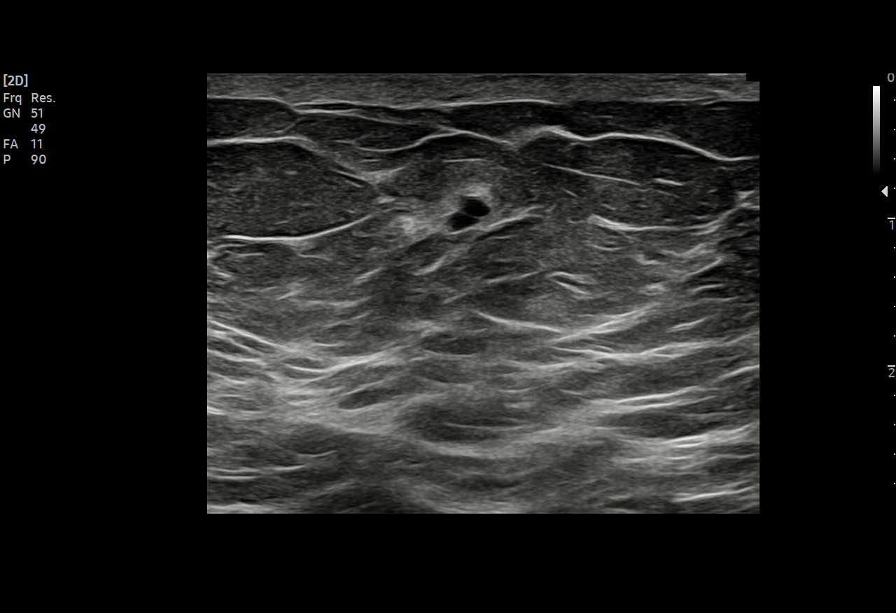
[im 2/7]
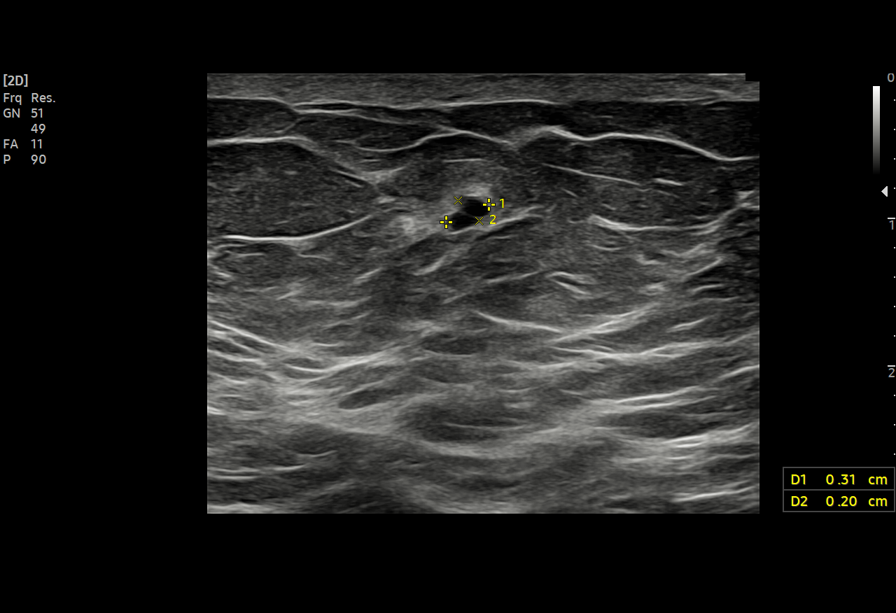
[im 3/7]
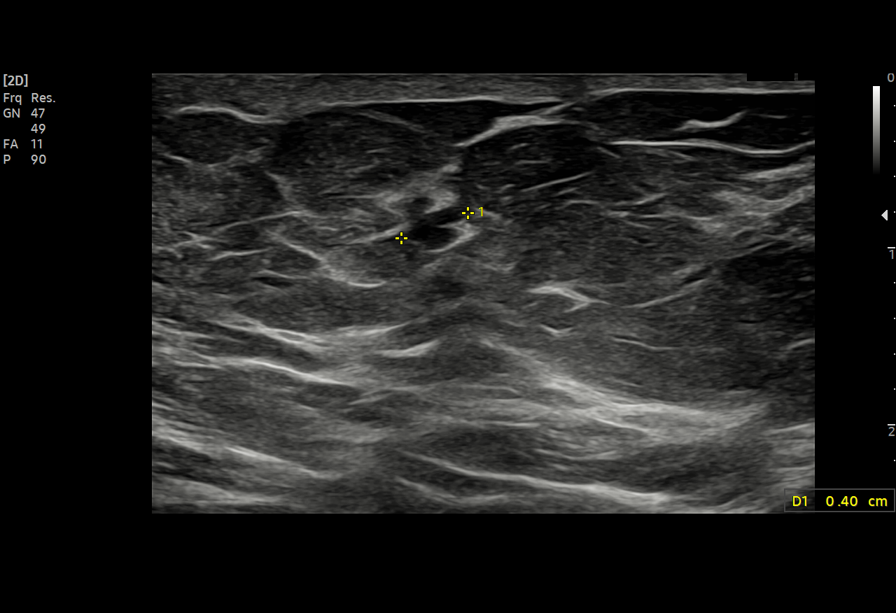
[im 4/7]
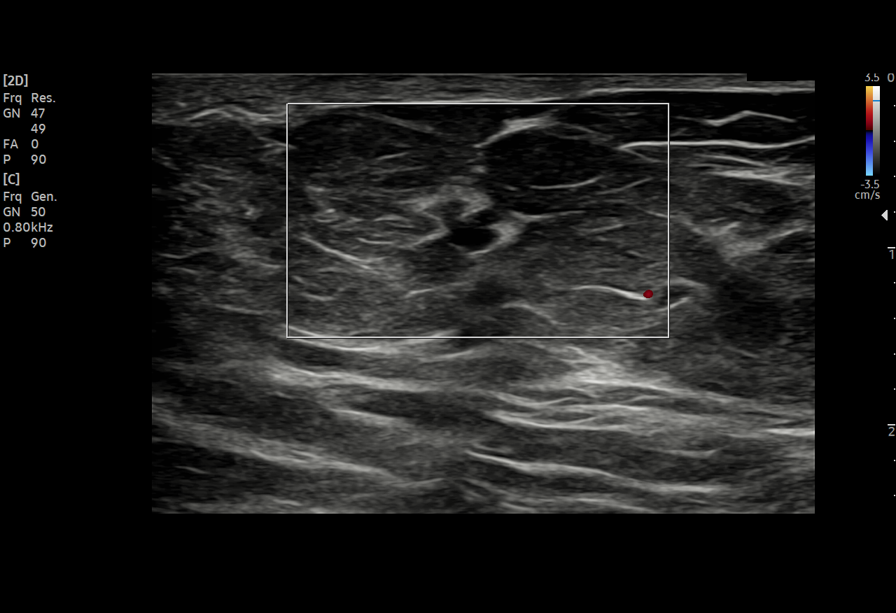
[im 5/7]
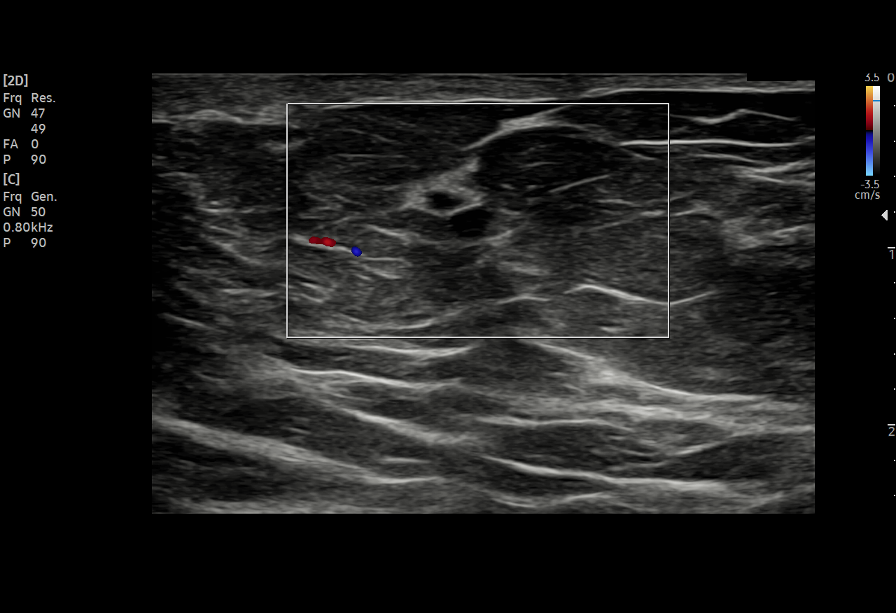
[im 6/7]
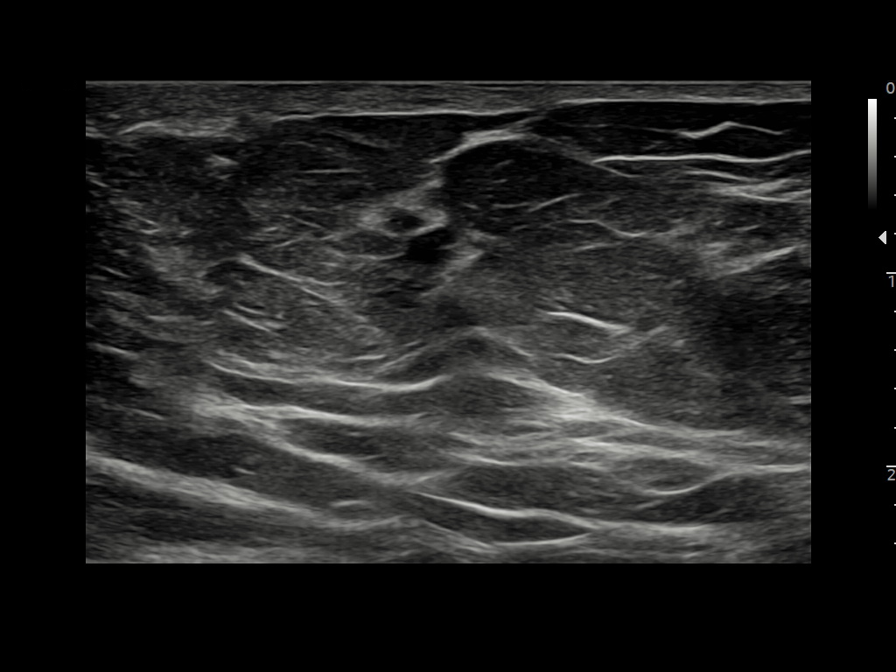
[im 7/7]
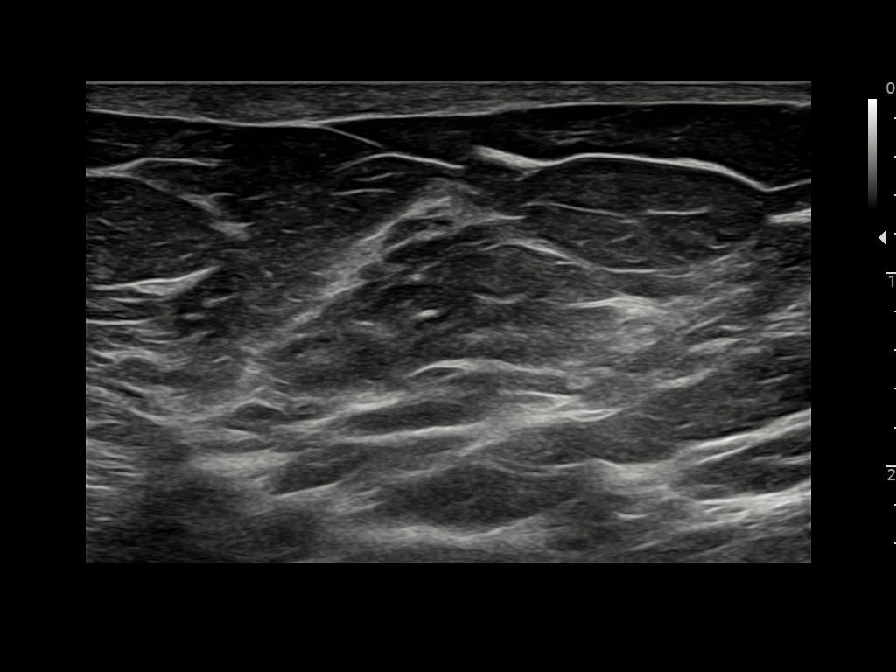

[7 of 7 positions shown; findings below may reference images not displayed]

FINDINGS: Patient's recent screening mammogram demonstrates a 3 mm oval
circumscribed mass over the outer mid to upper left periareolar
region.

Targeted ultrasound is performed, showing an oval circumscribed cyst
with thin septation versus cyst cluster at the 2 o'clock position of
the left periareolar region corresponding to the mammographic
finding. This measures 2 x 3 x 4 mm.
IMPRESSION: 4 mm minimally complicated cyst versus cyst cluster at the 2 o'clock
position of the left periareolar region which is a benign finding
accounting for the mammographic finding.

RECOMMENDATION:
Recommend continued annual bilateral screening mammographic
follow-up.

I have discussed the findings and recommendations with the patient.
If applicable, a reminder letter will be sent to the patient
regarding the next appointment.

BI-RADS CATEGORY  2: Benign.
